# Patient Record
Sex: Female | Born: 1995 | Hispanic: No | Marital: Single | Smoking: Current every day smoker
Health system: Southern US, Community
[De-identification: ages and names within clinical notes are randomized; demographics above are authoritative.]

## PROBLEM LIST (undated history)

## (undated) VITALS — BP 122/84 | HR 89 | Temp 98.3°F | Resp 16 | Ht 64.0 in | Wt 127.9 lb

## (undated) DIAGNOSIS — H539 Unspecified visual disturbance: Secondary | ICD-10-CM

## (undated) DIAGNOSIS — F32A Depression, unspecified: Secondary | ICD-10-CM

## (undated) DIAGNOSIS — F329 Major depressive disorder, single episode, unspecified: Secondary | ICD-10-CM

## (undated) DIAGNOSIS — R002 Palpitations: Secondary | ICD-10-CM

## (undated) DIAGNOSIS — F419 Anxiety disorder, unspecified: Secondary | ICD-10-CM

## (undated) HISTORY — PX: APPENDECTOMY: SHX54

---
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
## 2015-06-12 ENCOUNTER — Encounter (HOSPITAL_COMMUNITY): Payer: Self-pay | Admitting: Emergency Medicine

## 2015-06-12 ENCOUNTER — Emergency Department (HOSPITAL_COMMUNITY)
Admission: EM | Admit: 2015-06-12 | Discharge: 2015-06-12 | Disposition: A | Discharge status: Home / Self Care | Payer: Self-pay | Attending: Emergency Medicine | Admitting: Emergency Medicine

## 2015-06-12 DIAGNOSIS — Z8669 Personal history of other diseases of the nervous system and sense organs: Secondary | ICD-10-CM | POA: Insufficient documentation

## 2015-06-12 DIAGNOSIS — Y929 Unspecified place or not applicable: Secondary | ICD-10-CM | POA: Insufficient documentation

## 2015-06-12 DIAGNOSIS — Z79899 Other long term (current) drug therapy: Secondary | ICD-10-CM | POA: Insufficient documentation

## 2015-06-12 DIAGNOSIS — Z720 Tobacco use: Secondary | ICD-10-CM | POA: Insufficient documentation

## 2015-06-12 DIAGNOSIS — T407X4A Poisoning by cannabis (derivatives), undetermined, initial encounter: Secondary | ICD-10-CM | POA: Insufficient documentation

## 2015-06-12 DIAGNOSIS — IMO0002 Reserved for concepts with insufficient information to code with codable children: Secondary | ICD-10-CM

## 2015-06-12 DIAGNOSIS — Z791 Long term (current) use of non-steroidal anti-inflammatories (NSAID): Secondary | ICD-10-CM | POA: Insufficient documentation

## 2015-06-12 DIAGNOSIS — Y939 Activity, unspecified: Secondary | ICD-10-CM | POA: Insufficient documentation

## 2015-06-12 DIAGNOSIS — Y999 Unspecified external cause status: Secondary | ICD-10-CM | POA: Insufficient documentation

## 2015-06-12 DIAGNOSIS — F419 Anxiety disorder, unspecified: Secondary | ICD-10-CM | POA: Insufficient documentation

## 2015-06-12 DIAGNOSIS — X58XXXA Exposure to other specified factors, initial encounter: Secondary | ICD-10-CM | POA: Insufficient documentation

## 2015-06-12 NOTE — ED Provider Notes (Signed)
CSN: [id number]     Arrival date & time 06/12/15  1410 History   First MD Initiated Contact with Patient 06/12/15 1507     Chief Complaint  Patient presents with  . Drug Overdose     (Consider location/radiation/quality/duration/timing/severity/associated sxs/prior Treatment) HPI   Erin Arroyo is a 19 y.o. female who presents for evaluation of feeling detached, and nervous. This started after she took some "LSD" given to her by a friend. She also smoked some marijuana tonight. She denies headache, neck pain, back pain, weakness or dizziness. She came here by private vehicle, with her father. She has been taking her usual psychiatric medications. There are no other no modifying factors.  Past Medical History  Diagnosis Date  . Vision abnormalities   . Anxiety   . Heart palpitations    Past Surgical History  Procedure Laterality Date  . Appendectomy  Age 60   Family History  Problem Relation Age of Onset  . Anxiety disorder Mother   . Depression Mother   . Anxiety disorder Brother   . Depression Brother   . Depression Father    Social History  Substance Use Topics  . Smoking status: Current Every Day Smoker -- 0.50 packs/day    Types: Cigarettes    Last Attempt to Quit: 08/14/2012  . Smokeless tobacco: None  . Alcohol Use: Yes     Comment: last use Dec 2014   OB History    No data available     Review of Systems  All other systems reviewed and are negative.     Allergies  Cat hair extract and Dust mite extract  Home Medications   Prior to Admission medications   Medication Sig Start Date End Date Taking? Authorizing Provider  ARIPiprazole (ABILIFY) 5 MG tablet Take 5 mg by mouth daily.   Yes Historical Provider, MD  ibuprofen (ADVIL,MOTRIN) 400 MG tablet Take 1 tablet (400 mg total) by mouth every 6 (six) hours as needed. Patient taking differently: Take 400 mg by mouth every 6 (six) hours as needed for mild pain or moderate pain.  04/07/14  Yes Derwood KaplanAnkit  Nanavati, MD  naproxen (NAPROSYN) 375 MG tablet Take 1 tablet (375 mg total) by mouth 2 (two) times daily as needed. 09/18/14  Yes Blane OharaJoshua Zavitz, MD  sertraline (ZOLOFT) 100 MG tablet Take 100 mg by mouth daily.   Yes Historical Provider, MD  benzonatate (TESSALON) 100 MG capsule Take 1 capsule (100 mg total) by mouth every 8 (eight) hours. Patient not taking: Reported on 06/12/2015 10/01/14   Renne CriglerJoshua Geiple, PA-C   BP 127/79 mmHg  Pulse 100  Temp(Src) 98.4 F (36.9 C) (Oral)  Resp 18  Ht 5\' 4"  (1.626 m)  SpO2 99% Physical Exam  Constitutional: She is oriented to person, place, and time. She appears well-developed and well-nourished.  HENT:  Head: Normocephalic and atraumatic.  Right Ear: External ear normal.  Left Ear: External ear normal.  Eyes: Conjunctivae and EOM are normal. Pupils are equal, round, and reactive to light.  Neck: Normal range of motion and phonation normal. Neck supple.  Cardiovascular: Normal rate, regular rhythm and normal heart sounds.   Pulmonary/Chest: Effort normal and breath sounds normal. She exhibits no bony tenderness.  Abdominal: Soft. There is no tenderness.  Musculoskeletal: Normal range of motion.  Neurological: She is alert and oriented to person, place, and time. No cranial nerve deficit or sensory deficit. She exhibits normal muscle tone. Coordination normal.  No dysarthria and aphasia or nystagmus  Skin: Skin is warm, dry and intact.  Psychiatric: She has a normal mood and affect. Her behavior is normal. Judgment and thought content normal.  Nursing note and vitals reviewed.   ED Course  Procedures (including critical care time) Medications - No data to display  Patient Vitals for the past 24 hrs:  BP Temp Temp src Pulse Resp SpO2 Height  06/12/15 1420 - - - - - -  (1.626 m)  06/12/15 1413 127/79 mmHg 98.4 F (36.9 C) Oral 100 18 99 % -    Findings the patient's father who is willing to take her home and watch her.   Labs  Review Labs Reviewed - No data to display  Imaging Review No results found. I have personally reviewed and evaluated these images and lab results as part of my medical decision-making.   EKG Interpretation None      MDM   Final diagnoses:  LSD reaction    Reported LSD ingestion, with very mild symptoms. Doubt significant overdose or aggravation of her chronic psychiatric illness.  Nursing Notes Reviewed/ Care Coordinated Applicable Imaging Reviewed Interpretation of Laboratory Data incorporated into ED treatment  The patient appears reasonably screened and/or stabilized for discharge and I doubt any other medical condition or other [facility] requiring further screening, evaluation, or treatment in the ED at this time prior to discharge.  Plan: Home Medications- usual; Home Treatments- rest; return here if the recommended treatment, does not improve the symptoms; Recommended follow up- PCP prn     Mancel Bale, MD 06/12/15 2316

## 2015-06-12 NOTE — ED Notes (Signed)
BIB father, reports taking LSD and smoking marijuana, endorses hallucinations, is emotional and afraid but calm and cooperative, no pain

## 2015-06-12 NOTE — Discharge Instructions (Signed)
Avoid illegal substances.

## 2015-11-10 ENCOUNTER — Encounter (HOSPITAL_COMMUNITY): Payer: Self-pay | Admitting: Emergency Medicine

## 2015-11-10 ENCOUNTER — Emergency Department (HOSPITAL_COMMUNITY)
Admission: EM | Admit: 2015-11-10 | Discharge: 2015-11-10 | Disposition: A | Discharge status: Home / Self Care | Payer: Self-pay | Attending: Emergency Medicine | Admitting: Emergency Medicine

## 2015-11-10 ENCOUNTER — Emergency Department (HOSPITAL_COMMUNITY): Payer: Self-pay

## 2015-11-10 DIAGNOSIS — F419 Anxiety disorder, unspecified: Secondary | ICD-10-CM | POA: Insufficient documentation

## 2015-11-10 DIAGNOSIS — Y9389 Activity, other specified: Secondary | ICD-10-CM | POA: Insufficient documentation

## 2015-11-10 DIAGNOSIS — Y998 Other external cause status: Secondary | ICD-10-CM | POA: Insufficient documentation

## 2015-11-10 DIAGNOSIS — Z79899 Other long term (current) drug therapy: Secondary | ICD-10-CM | POA: Insufficient documentation

## 2015-11-10 DIAGNOSIS — M25571 Pain in right ankle and joints of right foot: Secondary | ICD-10-CM

## 2015-11-10 DIAGNOSIS — F17210 Nicotine dependence, cigarettes, uncomplicated: Secondary | ICD-10-CM | POA: Insufficient documentation

## 2015-11-10 DIAGNOSIS — Y9289 Other specified places as the place of occurrence of the external cause: Secondary | ICD-10-CM | POA: Insufficient documentation

## 2015-11-10 DIAGNOSIS — X58XXXA Exposure to other specified factors, initial encounter: Secondary | ICD-10-CM | POA: Insufficient documentation

## 2015-11-10 DIAGNOSIS — S99911A Unspecified injury of right ankle, initial encounter: Secondary | ICD-10-CM | POA: Insufficient documentation

## 2015-11-10 MED ORDER — IBUPROFEN 800 MG PO TABS: 800.0000 mg | ORAL_TABLET | Freq: Three times a day (TID) | ORAL | Status: DC | PRN

## 2015-11-10 NOTE — ED Notes (Signed)
Declined W/C at D/C and was escorted to lobby by RN. 

## 2015-11-10 NOTE — Discharge Instructions (Signed)
Read the information below.  Use the prescribed medication as directed.  Please discuss all new medications with your pharmacist.  You may return to the Emergency Department at any time for worsening condition or any new symptoms that concern you.    If you develop uncontrolled pain, weakness or numbness of the extremity, severe discoloration of the skin, or you are unable to walk, return to the ER for a recheck.      Ankle Pain Ankle pain is a common symptom. The bones, cartilage, tendons, and muscles of the ankle joint perform a lot of work each day. The ankle joint holds your body weight and allows you to move around. Ankle pain can occur on either side or back of 1 or both ankles. Ankle pain may be sharp and burning or dull and aching. There may be tenderness, stiffness, redness, or warmth around the ankle. The pain occurs more often when a person walks or puts pressure on the ankle. CAUSES  There are many reasons ankle pain can develop. It is important to work with your caregiver to identify the cause since many conditions can impact the bones, cartilage, muscles, and tendons. Causes for ankle pain include:  Injury, including a break (fracture), sprain, or strain often due to a fall, sports, or a high-impact activity.  Swelling (inflammation) of a tendon (tendonitis).  Achilles tendon rupture.  Ankle instability after repeated sprains and strains.  Poor foot alignment.  Pressure on a nerve (tarsal tunnel syndrome).  Arthritis in the ankle or the lining of the ankle.  Crystal formation in the ankle (gout or pseudogout). DIAGNOSIS  A diagnosis is based on your medical history, your symptoms, results of your physical exam, and results of diagnostic tests. Diagnostic tests may include X-ray exams or a computerized magnetic scan (magnetic resonance imaging, MRI). TREATMENT  Treatment will depend on the cause of your ankle pain and may include:  Keeping pressure off the ankle and limiting  activities.  Using crutches or other walking support (a cane or brace).  Using rest, ice, compression, and elevation.  Participating in physical therapy or home exercises.  Wearing shoe inserts or special shoes.  Losing weight.  Taking medications to reduce pain or swelling or receiving an injection.  Undergoing surgery. HOME CARE INSTRUCTIONS   Only take over-the-counter or prescription medicines for pain, discomfort, or fever as directed by your caregiver.  Put ice on the injured area.  Put ice in a plastic bag.  Place a towel between your skin and the bag.  Leave the ice on for 15-20 minutes at a time, 03-04 times a day.  Keep your leg raised (elevated) when possible to lessen swelling.  Avoid activities that cause ankle pain.  Follow specific exercises as directed by your caregiver.  Record how often you have ankle pain, the location of the pain, and what it feels like. This information may be helpful to you and your caregiver.  Ask your caregiver about returning to work or sports and whether you should drive.  Follow up with your caregiver for further examination, therapy, or testing as directed. SEEK MEDICAL CARE IF:   Pain or swelling continues or worsens beyond 1 week.  You have an oral temperature above 102 F (38.9 C).  You are feeling unwell or have chills.  You are having an increasingly difficult time with walking.  You have loss of sensation or other new symptoms.  You have questions or concerns. MAKE SURE YOU:   Understand these instructions.  Will watch your condition.  Will get help right away if you are not doing well or get worse.   This information is not intended to replace advice given to you by your health care provider. Make sure you discuss any questions you have with your health care provider.   Document Released: 01/27/2010 Document Revised: 11/01/2011 Document Reviewed: 03/11/2015 Elsevier Interactive Patient Education 2016  [employer]. [employer] Assistance The [employer] 211 is a great source of information about community services available.  Access by dialing 2-1-1 from anywhere in  [place], or by website -  PooledIncome.pl.   Other Local Resources (Updated 08/2015)  Financial Assistance   Services    Phone Number and Address  [facility]  Low-cost medical care - 1st and 3rd Saturday of every month  Must not qualify for public or private insurance and must have limited income [phone] [address] [place], [place]    [place] [employer] of Social Services  Child care  Emergency assistance for housing and [employer]  Medicaid [phone] [address] [place], [place] [address]   [facility] Department  Low-cost medical care for children, communicable diseases, sexually-transmitted diseases, immunizations, maternity care, womens health and family planning [phone] [address] [place], [place] [address]  [facility]   Medication assistance for [facility] residents  Must meet income requirements [phone] [address] [place], [place].    [facility] Social Services  Child care  Emergency assistance for housing and [employer]  Medicaid [phone] [address] [place], [place] [address]  [facility]   Low-cost medical care,   Monday through Friday, 9 am to 6 pm.   Accepts Medicare/Medicaid, and self-pay [phone] [address] [place], [place] [address]  [facility]  Low-cost medical care - Monday through Friday, 8:30 am - 5:30 pm  Accepts Medicaid and self-pay [phone] [address] [place], [place] [address]   [facility]  Primary medical care, including for those with sickle cell  disease  Accepts Medicare, Medicaid, insurance and self-pay [phone] [address] [place], [place]  [facility]   Primary medical care  Accepts Medicare, [place], insurance and self-pay [phone] [address] [place], [place] [address]   [facility] Department of Social Services  Child care  Emergency assistance for housing and [employer]  Medicaid [phone] [address] [place], [place] [address]  [facility] Department of Health and [employer]  Child care  Emergency assistance for housing and [employer]  Medicaid [phone] [address] [place], [place] [address]   [facility] Medication Assistance Program  Medication assistance for [facility] residents with no insurance only  Must have a primary care doctor [phone] [address] [place], [place]  [facility]   Primary medical care  [place], [place], insurance  [phone] [address] [place], [place]  [facility]   Medication assistance [phone]  [facility]   Primary medical care  Accepts Medicare, [place], insurance and self-pay [phone] [address] [place], [place] [address]  [facility]   Primary medical care  Accepts Medicare, [place], insurance and self-pay [phone] [address] [place], [place] [address]  [facility]  For [place] residents between the ages  of 18 and 64 who do not have any form of health insurance, Medicare, Medicaid, or VA benefits.  Services are provided free of charge to uninsured patients who fall within federal poverty guidelines.    Hours: Tuesdays and Thursdays, 4:15 - 8 pm [phone] [address] [place], [place] [address]  [facility]     Primary medical care  Dental care  Nutritional  counseling  Pharmacy  Accepts Medicaid, Medicare, most insurance.  Fees are adjusted based on ability to pay.   [phone] [facility] [address] [place], [place]  [phone] [facility] [address] [place], [place]  [phone] [facility] [place], [place]  [phone] [facility], [address] [place], [place]  [phone] [facility] [address] [place], [place]  [facility]  Womens health and family planning [phone] [address] [facility] [employer] [facility] [place], [place]  [facility] Department of Social Services  Child care  Emergency assistance for housing and [employer]  Medicaid [phone] [place], [place] [address]   [facility]    Ages 38 and older  Hours: Mondays and Thursdays, 7:00 am - 9:00 am Patients are seen on a first come, first served basis. [phone], ext. [phone] [place], [place]  [facility] Division of Social Services  Child care  Emergency assistance for housing and [employer]  Medicaid [phone] [address] [place], [place] [address]  [facility]  Medication assistance  Rental assistance  Food pantry  Medication assistance  Housing assistance  Emergency food distribution  Utility assistance [phone] [address] [place], [place]  [phone]  [address] [place], [place] [address] Hours: Tuesdays and Thursdays from 9am - 12 noon by appointment only  [phone] [address] [place], [place] [address]  [facility] - Lanae Boast   Accepts private insurance, [place], and [place].  Payment is based on a sliding scale for those without insurance.  Hours: Mondays, Tuesdays and Thursdays, 8:30 am - 5:30 pm.   [phone] [address] [place], [place]   [facility] - Family Medicine at [facility], [place], and [place].  Payment is based on a sliding scale for those without insurance. [phone] [address] [place], [place]  [facility] - Pediatrics at [address] Scientist, research (physical sciences), [employer], and [place].  Payment is based on a sliding scale for those without insurance [phone] [address], [employer], [place]  [facility] - Pediatrics at [employer], [place], and [place].  Payment is based on a sliding scale for those without insurance. [phone] [address] [place], [place]  [facility] - Pediatrics at [facility], [place], and [place].  Payment is based on a sliding scale for those without insurance. [phone], [phone] [place], [place].    [facility]  Maternity care.  Accepts Medicaid and self-pay. [phone] [address] [place], [place]

## 2015-11-10 NOTE — ED Provider Notes (Signed)
CSN: [id number]     Arrival date & time 11/10/15  0709 History   First MD Initiated Contact with Patient 11/10/15 [phone]     Chief Complaint  Patient presents with  . Ankle Pain     (Consider location/radiation/quality/duration/timing/severity/associated sxs/prior Treatment) The history is provided by the patient.     Pt presents with right ankle pain after stepping off a curb and hearing a pop yesterday.  Has tenderness in the medial/anterior aspect of her ankle only.  Denies other pain.  Feels pain with moving toes.  Denies weakness or numbness.  Has taken excedrine for pain.    Past Medical History  Diagnosis Date  . Vision abnormalities   . Anxiety   . Heart palpitations    Past Surgical History  Procedure Laterality Date  . Appendectomy  Age 20   Family History  Problem Relation Age of Onset  . Anxiety disorder Mother   . Depression Mother   . Anxiety disorder Brother   . Depression Brother   . Depression Father    Social History  Substance Use Topics  . Smoking status: Current Every Day Smoker -- 0.50 packs/day    Types: Cigarettes    Last Attempt to Quit: 08/14/2012  . Smokeless tobacco: None  . Alcohol Use: No     Comment: last use Dec 2014   OB History    No data available     Review of Systems  Constitutional: Negative for fever and chills.  Cardiovascular: Negative for leg swelling.  Musculoskeletal: Positive for arthralgias.  Skin: Negative for color change, pallor, rash and wound.  Allergic/Immunologic: Negative for immunocompromised state.  Neurological: Negative for weakness and numbness.  Hematological: Does not bruise/bleed easily.  Psychiatric/Behavioral: Positive for self-injury (accidental ).      Allergies  Cat hair extract and Dust mite extract  Home Medications   Prior to Admission medications   Medication Sig Start Date End Date Taking? Authorizing Provider  ARIPiprazole (ABILIFY) 5 MG tablet Take 5 mg by mouth daily.     Historical Provider, MD  benzonatate (TESSALON) 100 MG capsule Take 1 capsule (100 mg total) by mouth every 8 (eight) hours. Patient not taking: Reported on 06/12/2015 10/01/14   Renne CriglerJoshua Geiple, PA-C  ibuprofen (ADVIL,MOTRIN) 400 MG tablet Take 1 tablet (400 mg total) by mouth every 6 (six) hours as needed. Patient taking differently: Take 400 mg by mouth every 6 (six) hours as needed for mild pain or moderate pain.  04/07/14   Derwood KaplanAnkit Nanavati, MD  naproxen (NAPROSYN) 375 MG tablet Take 1 tablet (375 mg total) by mouth 2 (two) times daily as needed. 09/18/14   Blane OharaJoshua Zavitz, MD  sertraline (ZOLOFT) 100 MG tablet Take 100 mg by mouth daily.    Historical Provider, MD   BP 125/84 mmHg  Pulse 97  Temp(Src) 98.3 F (36.8 C) (Oral)  Resp 18  Ht 5\' 5"  (1.651 m)  Wt 68.04 kg  BMI 24.96 kg/m2  SpO2 98%  LMP 10/19/2015 Physical Exam  Constitutional: She appears well-developed and well-nourished. No distress.  HENT:  Head: Normocephalic and atraumatic.  Neck: Neck supple.  Pulmonary/Chest: Effort normal.  Musculoskeletal:       Right ankle: She exhibits no ecchymosis, no deformity, no laceration and normal pulse. Achilles tendon normal.       Right lower leg: She exhibits no swelling, no edema, no deformity and no laceration.       Right foot: Normal.       Feet:  Right lateral lower leg with tattoos (most recent is 29 month old)  No erythema, edema, warmth, focal tenderness.    Neurological: She is alert.  Skin: She is not diaphoretic.  Nursing note and vitals reviewed.   ED Course  Procedures (including critical care time) Labs Review Labs Reviewed - No data to display  Imaging Review Dg Ankle Complete Right  11/10/2015  CLINICAL DATA:  Fall, missed a step of the curb 1 day ago, right ankle pain, rolled ankle EXAM: RIGHT ANKLE - COMPLETE 3+ VIEW COMPARISON:  None. FINDINGS: Three views of the right ankle submitted. No acute fracture or subluxation. No radiopaque foreign body. Ankle  mortise is preserved. IMPRESSION: Negative. Electronically Signed   By: Natasha Mead M.D.   On: 11/10/2015 08:02   I have personally reviewed and evaluated these images and lab results as part of my medical decision-making.   EKG Interpretation None      MDM   Final diagnoses:  Right ankle pain    Afebrile, nontoxic patient with injury to her right ankle while stepping off a curb.   Xray negative.  Neurovascularly intact.  ASO ordered, pt requested CAM walker and was given CAM walker and crutches.   D/C home with ibuprofen, PCP follow up PRN.  Discussed result, findings, treatment, and follow up  with patient.  Pt given return precautions.  Pt verbalizes understanding and agrees with plan.        [place], PA-C 11/10/15 1610  Melene Plan, DO 11/10/15 [phone]

## 2015-11-10 NOTE — Progress Notes (Signed)
Orthopedic Tech Progress Note Patient Details:  Donnajean LopesMaha Barich Nov 14, 1995 [id number]  Ortho Devices Type of Ortho Device: Crutches, CAM walker Ortho Device/Splint Interventions: Application, Adjustment   Saul FordyceJennifer C Cielo Arias 11/10/2015, 9:21 AM

## 2015-11-10 NOTE — ED Notes (Signed)
Pt reports that she missed a step off the curb and felt her right ankle roll.

## 2015-12-21 ENCOUNTER — Emergency Department (HOSPITAL_COMMUNITY): Payer: Self-pay

## 2015-12-21 ENCOUNTER — Encounter (HOSPITAL_COMMUNITY): Payer: Self-pay | Admitting: Emergency Medicine

## 2015-12-21 ENCOUNTER — Emergency Department (HOSPITAL_COMMUNITY)
Admission: EM | Admit: 2015-12-21 | Discharge: 2015-12-22 | Disposition: A | Discharge status: Other Acute Inpatient Hospital | Payer: Self-pay | Attending: Emergency Medicine | Admitting: Emergency Medicine

## 2015-12-21 DIAGNOSIS — S40021A Contusion of right upper arm, initial encounter: Secondary | ICD-10-CM | POA: Insufficient documentation

## 2015-12-21 DIAGNOSIS — R45851 Suicidal ideations: Secondary | ICD-10-CM | POA: Insufficient documentation

## 2015-12-21 DIAGNOSIS — F17210 Nicotine dependence, cigarettes, uncomplicated: Secondary | ICD-10-CM | POA: Insufficient documentation

## 2015-12-21 DIAGNOSIS — F1210 Cannabis abuse, uncomplicated: Secondary | ICD-10-CM | POA: Insufficient documentation

## 2015-12-21 DIAGNOSIS — F1310 Sedative, hypnotic or anxiolytic abuse, uncomplicated: Secondary | ICD-10-CM | POA: Insufficient documentation

## 2015-12-21 DIAGNOSIS — Y92009 Unspecified place in unspecified non-institutional (private) residence as the place of occurrence of the external cause: Secondary | ICD-10-CM | POA: Insufficient documentation

## 2015-12-21 DIAGNOSIS — S0990XA Unspecified injury of head, initial encounter: Secondary | ICD-10-CM | POA: Insufficient documentation

## 2015-12-21 DIAGNOSIS — S0083XA Contusion of other part of head, initial encounter: Secondary | ICD-10-CM | POA: Insufficient documentation

## 2015-12-21 DIAGNOSIS — Y9389 Activity, other specified: Secondary | ICD-10-CM | POA: Insufficient documentation

## 2015-12-21 DIAGNOSIS — S199XXA Unspecified injury of neck, initial encounter: Secondary | ICD-10-CM | POA: Insufficient documentation

## 2015-12-21 DIAGNOSIS — F1410 Cocaine abuse, uncomplicated: Secondary | ICD-10-CM | POA: Insufficient documentation

## 2015-12-21 DIAGNOSIS — Y998 Other external cause status: Secondary | ICD-10-CM | POA: Insufficient documentation

## 2015-12-21 DIAGNOSIS — Z79899 Other long term (current) drug therapy: Secondary | ICD-10-CM | POA: Insufficient documentation

## 2015-12-21 DIAGNOSIS — F419 Anxiety disorder, unspecified: Secondary | ICD-10-CM | POA: Insufficient documentation

## 2015-12-21 DIAGNOSIS — Z3202 Encounter for pregnancy test, result negative: Secondary | ICD-10-CM | POA: Insufficient documentation

## 2015-12-21 DIAGNOSIS — Z8669 Personal history of other diseases of the nervous system and sense organs: Secondary | ICD-10-CM | POA: Insufficient documentation

## 2015-12-21 LAB — URINALYSIS, ROUTINE W REFLEX MICROSCOPIC
Bilirubin Urine: NEGATIVE
GLUCOSE, UA: NEGATIVE mg/dL
Ketones, ur: NEGATIVE mg/dL
LEUKOCYTES UA: NEGATIVE
Nitrite: NEGATIVE
PH: 6 (ref 5.0–8.0)
Protein, ur: NEGATIVE mg/dL
Specific Gravity, Urine: 1.008 (ref 1.005–1.030)

## 2015-12-21 LAB — CBC WITH DIFFERENTIAL/PLATELET
BASOS PCT: 1 %
Basophils Absolute: 0.1 10*3/uL (ref 0.0–0.1)
EOS ABS: 0.3 10*3/uL (ref 0.0–0.7)
Eosinophils Relative: 3 %
HCT: 42 % (ref 36.0–46.0)
HEMOGLOBIN: 14.2 g/dL (ref 12.0–15.0)
Lymphocytes Relative: 26 %
Lymphs Abs: 2.3 10*3/uL (ref 0.7–4.0)
MCH: 29.8 pg (ref 26.0–34.0)
MCHC: 33.8 g/dL (ref 30.0–36.0)
MCV: 88.2 fL (ref 78.0–100.0)
MONO ABS: 0.8 10*3/uL (ref 0.1–1.0)
MONOS PCT: 9 %
NEUTROS PCT: 61 %
Neutro Abs: 5.4 10*3/uL (ref 1.7–7.7)
Platelets: 317 10*3/uL (ref 150–400)
RBC: 4.76 MIL/uL (ref 3.87–5.11)
RDW: 13.1 % (ref 11.5–15.5)
WBC: 8.8 10*3/uL (ref 4.0–10.5)

## 2015-12-21 LAB — ETHANOL: Alcohol, Ethyl (B): 130 mg/dL — ABNORMAL HIGH (ref <5)

## 2015-12-21 LAB — RAPID URINE DRUG SCREEN, HOSP PERFORMED
Amphetamines: NOT DETECTED
Barbiturates: NOT DETECTED
Benzodiazepines: POSITIVE — AB
COCAINE: POSITIVE — AB
OPIATES: NOT DETECTED
Tetrahydrocannabinol: POSITIVE — AB

## 2015-12-21 LAB — COMPREHENSIVE METABOLIC PANEL
ALBUMIN: 3.9 g/dL (ref 3.5–5.0)
ALK PHOS: 85 U/L (ref 38–126)
ALT: 35 U/L (ref 14–54)
ANION GAP: 12 (ref 5–15)
AST: 51 U/L — ABNORMAL HIGH (ref 15–41)
BILIRUBIN TOTAL: 0.4 mg/dL (ref 0.3–1.2)
BUN: 6 mg/dL (ref 6–20)
CALCIUM: 9.2 mg/dL (ref 8.9–10.3)
CO2: 20 mmol/L — ABNORMAL LOW (ref 22–32)
Chloride: 108 mmol/L (ref 101–111)
Creatinine, Ser: 0.75 mg/dL (ref 0.44–1.00)
GFR calc Af Amer: >60 mL/min (ref >60)
GLUCOSE: 93 mg/dL (ref 65–99)
Potassium: 3.5 mmol/L (ref 3.5–5.1)
Sodium: 140 mmol/L (ref 135–145)
TOTAL PROTEIN: 8 g/dL (ref 6.5–8.1)

## 2015-12-21 LAB — SALICYLATE LEVEL: Salicylate Lvl: <4 mg/dL (ref 2.8–30.0)

## 2015-12-21 LAB — URINE MICROSCOPIC-ADD ON: WBC, UA: NONE SEEN WBC/hpf (ref 0–5)

## 2015-12-21 LAB — PREGNANCY, URINE: Preg Test, Ur: NEGATIVE

## 2015-12-21 LAB — ACETAMINOPHEN LEVEL

## 2015-12-21 MED ORDER — ONDANSETRON HCL 4 MG PO TABS: 4.0000 mg | ORAL_TABLET | Freq: Three times a day (TID) | ORAL | Status: DC | PRN

## 2015-12-21 MED ORDER — ACETAMINOPHEN 325 MG PO TABS: 650.0000 mg | ORAL_TABLET | ORAL | Status: DC | PRN

## 2015-12-21 MED ORDER — SERTRALINE HCL 50 MG PO TABS
100.0000 mg | ORAL_TABLET | Freq: Every day | ORAL | Status: DC
  Administered 2015-12-21 – 2015-12-22 (×2): 100 mg via ORAL
  Filled 2015-12-21 (×2): qty 2

## 2015-12-21 MED ORDER — ARIPIPRAZOLE 10 MG PO TABS
5.0000 mg | ORAL_TABLET | Freq: Every day | ORAL | Status: DC
  Administered 2015-12-21: 5 mg via ORAL
  Filled 2015-12-21: qty 1
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
## 2015-12-21 NOTE — ED Notes (Signed)
Pt now states if she goes home she will try to kill herself er md notified
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
## 2015-12-21 NOTE — ED Provider Notes (Signed)
I received a call from pharmacy, that the patient is not currently taking Abilify. This medication was continued in favor of another second-generation antipsychotic (Rexulti). That medicine [facility]. At this point will not change medications, but have psychiatry evaluate the patient, for appropriate medication administration in conjunction with their evaluation of her suicidal ideation.  Mancel BaleElliott Sherea Liptak, MD 12/21/15 2218
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
## 2015-12-21 NOTE — ED Notes (Signed)
Pt given hygiene supplies
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
## 2015-12-21 NOTE — ED Notes (Signed)
Sleeping soundly at this time with no complaints voiced.

## 2015-12-21 NOTE — BH Assessment (Signed)
[facility] Assessment Progress Note At 1302, 12/21/15, Pt requested reassessment away from presence of father (father was present for initial assessment).  During reassessment, Pt endorsed strong desire to self-injure by cutting and scratching skin "until the blood gushes."  Pt said she denied suicidal ideation and self-injury desire because father was whispering off-camera that she should deny any intent to self-harm.  Now that father is out of the room, "I really want to come in ... If I go home, I'm going to hurt myself."  All other factors from first assessment are intact.  Consulted with S. Rankin and provided updated information.  Per S. Rankin, NP, Pt meets inpatient criteria.

## 2015-12-22 ENCOUNTER — Encounter (HOSPITAL_COMMUNITY): Payer: Self-pay | Admitting: *Deleted

## 2015-12-22 ENCOUNTER — Inpatient Hospital Stay (HOSPITAL_COMMUNITY)
Admission: AD | Admit: 2015-12-22 | Discharge: 2015-12-23 | DRG: 885 | Disposition: A | Discharge status: Home / Self Care | Payer: Federal, State, Local not specified - Other | Source: Intra-hospital | Attending: Psychiatry | Admitting: Psychiatry

## 2015-12-22 DIAGNOSIS — F411 Generalized anxiety disorder: Secondary | ICD-10-CM | POA: Diagnosis present

## 2015-12-22 DIAGNOSIS — R45851 Suicidal ideations: Secondary | ICD-10-CM | POA: Diagnosis present

## 2015-12-22 DIAGNOSIS — F410 Panic disorder [episodic paroxysmal anxiety] without agoraphobia: Secondary | ICD-10-CM | POA: Diagnosis present

## 2015-12-22 DIAGNOSIS — Z818 Family history of other mental and behavioral disorders: Secondary | ICD-10-CM

## 2015-12-22 DIAGNOSIS — F17210 Nicotine dependence, cigarettes, uncomplicated: Secondary | ICD-10-CM | POA: Diagnosis present

## 2015-12-22 DIAGNOSIS — F332 Major depressive disorder, recurrent severe without psychotic features: Secondary | ICD-10-CM | POA: Diagnosis present

## 2015-12-22 DIAGNOSIS — Z915 Personal history of self-harm: Secondary | ICD-10-CM

## 2015-12-22 DIAGNOSIS — F1910 Other psychoactive substance abuse, uncomplicated: Secondary | ICD-10-CM | POA: Diagnosis not present

## 2015-12-22 MED ORDER — MAGNESIUM HYDROXIDE 400 MG/5ML PO SUSP: 30.0000 mL | Freq: Every day | ORAL | Status: DC | PRN

## 2015-12-22 MED ORDER — ACETAMINOPHEN 325 MG PO TABS: 650.0000 mg | ORAL_TABLET | Freq: Four times a day (QID) | ORAL | Status: DC | PRN

## 2015-12-22 MED ORDER — ONDANSETRON HCL 4 MG PO TABS: 4.0000 mg | ORAL_TABLET | Freq: Three times a day (TID) | ORAL | Status: DC | PRN

## 2015-12-22 MED ORDER — NICOTINE 21 MG/24HR TD PT24
21.0000 mg | MEDICATED_PATCH | Freq: Every day | TRANSDERMAL | Status: DC
  Administered 2015-12-23: 21 mg via TRANSDERMAL
  Filled 2015-12-22 (×3): qty 1

## 2015-12-22 MED ORDER — ALUM & MAG HYDROXIDE-SIMETH 200-200-20 MG/5ML PO SUSP: 30.0000 mL | ORAL | Status: DC | PRN

## 2015-12-22 MED ORDER — NICOTINE 21 MG/24HR TD PT24
21.0000 mg | MEDICATED_PATCH | Freq: Every day | TRANSDERMAL | Status: DC
  Administered 2015-12-22: 21 mg via TRANSDERMAL
  Filled 2015-12-22: qty 1

## 2015-12-22 MED ORDER — TRAZODONE HCL 50 MG PO TABS
50.0000 mg | ORAL_TABLET | Freq: Every evening | ORAL | Status: DC | PRN
  Filled 2015-12-22: qty 7

## 2015-12-22 MED ORDER — SERTRALINE HCL 100 MG PO TABS
100.0000 mg | ORAL_TABLET | Freq: Every day | ORAL | Status: DC
  Administered 2015-12-23: 100 mg via ORAL
  Filled 2015-12-22: qty 1
  Filled 2015-12-22: qty 7
  Filled 2015-12-22 (×2): qty 1

## 2015-12-22 MED ORDER — HYDROXYZINE HCL 25 MG PO TABS: 25.0000 mg | ORAL_TABLET | Freq: Four times a day (QID) | ORAL | Status: DC | PRN

## 2015-12-22 NOTE — Progress Notes (Signed)
Patient was accepted at [facility], to Dr. Dub MikesLugo, room 302-[facility], arrive at 20:00. RN aware. Call report #: [phone].  Melbourne Abtsatia Samauri Kellenberger, LCSWA Disposition staff 12/22/2015 4:11 PM

## 2015-12-22 NOTE — Tx Team (Signed)
Initial Interdisciplinary Treatment Plan   PATIENT STRESSORS: Legal issue Substance abuse Traumatic event   PATIENT STRENGTHS: Ability for insight Average or above average intelligence Physical Health Supportive family/friends   PROBLEM LIST: Problem List/Patient Goals Date to be addressed Date deferred Reason deferred Estimated date of resolution  At risk for suicide 12/22/2015  12/22/2015   D/C  Anxiety 12/22/2015  12/22/2015   D/C  Depression 12/22/2015  12/22/2015   D/C  "No smoking as much weed as I do" 12/22/2015  12/22/2015   D/C  "I just want to go home tomorrow" 12/22/2015  12/22/2015   D/C                           DISCHARGE CRITERIA:  Improved stabilization in mood, thinking, and/or behavior Motivation to continue treatment in a less acute level of care Need for constant or close observation no longer present Withdrawal symptoms are absent or subacute and managed without 24-hour nursing intervention  PRELIMINARY DISCHARGE PLAN: Outpatient therapy Return to previous living arrangement Return to previous work or school arrangements  PATIENT/FAMIILY INVOLVEMENT: This treatment plan has been presented to and reviewed with the patient, Erin Arroyo.  The patient and family have been given the opportunity to ask questions and make suggestions.  Erin Arroyo, Erin Arroyo 12/22/2015, 11:00 PM
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
## 2015-12-22 NOTE — ED Notes (Signed)
Pt up walking in hallway

## 2015-12-22 NOTE — ED Notes (Signed)
Ordered patient breakfast Tray.

## 2015-12-22 NOTE — ED Notes (Signed)
[facility] pt may go there at 2100

## 2015-12-23 ENCOUNTER — Encounter (HOSPITAL_COMMUNITY): Payer: Self-pay | Admitting: Psychiatry

## 2015-12-23 DIAGNOSIS — F332 Major depressive disorder, recurrent severe without psychotic features: Principal | ICD-10-CM

## 2015-12-23 DIAGNOSIS — F1910 Other psychoactive substance abuse, uncomplicated: Secondary | ICD-10-CM

## 2015-12-23 MED ORDER — BREXPIPRAZOLE 2 MG PO TABS: 2.0000 mg | ORAL_TABLET | Freq: Every evening | ORAL | Status: AC

## 2015-12-23 MED ORDER — ETONOGESTREL 68 MG ~~LOC~~ IMPL: 68.0000 mg | DRUG_IMPLANT | Freq: Once | SUBCUTANEOUS | Status: AC

## 2015-12-23 MED ORDER — SERTRALINE HCL 100 MG PO TABS: 200.0000 mg | ORAL_TABLET | Freq: Every day | ORAL | Status: DC

## 2015-12-23 MED ORDER — CEFTRIAXONE SODIUM 250 MG IJ SOLR
250.0000 mg | Freq: Once | INTRAMUSCULAR | Status: AC
  Administered 2015-12-23: 250 mg via INTRAMUSCULAR
  Filled 2015-12-23: qty 250

## 2015-12-23 MED ORDER — CEFUROXIME AXETIL 500 MG PO TABS
500.0000 mg | ORAL_TABLET | Freq: Two times a day (BID) | ORAL | Status: DC
  Filled 2015-12-23: qty 1

## 2015-12-23 MED ORDER — AZITHROMYCIN 250 MG PO TABS
1000.0000 mg | ORAL_TABLET | Freq: Once | ORAL | Status: AC
  Administered 2015-12-23: 1000 mg via ORAL
  Filled 2015-12-23: qty 4
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
## 2015-12-23 NOTE — [facility] Group Notes (Signed)
[facility] LCSW Group Therapy  12/23/2015 1:31 PM  Type of Therapy:  Group Therapy  Participation Level:  Active  Participation Quality:  Attentive  Affect:  Appropriate  Cognitive:  Alert and Oriented  Insight:  Improving  Engagement in Therapy:  Engaged  Modes of Intervention:  Discussion, Education, Exploration, Problem-solving, Rapport Building, Socialization and Support  Summary of Progress/Problems: MHA Speaker came to talk about his personal journey with substance abuse and addiction. The pt processed ways by which to relate to the speaker. MHA speaker provided handouts and educational information pertaining to groups and services offered by the [facility].   Smart, Lendon George LCSW 12/23/2015, 1:31 PM

## 2015-12-23 NOTE — Progress Notes (Signed)
  [facility] :  Will you be returning to the same living situation after discharge:  Yes,  home with parents At discharge, do you have transportation home?: Yes,  pt' mother will pick her up today after lunch. Do you have the ability to pay for your medications: Yes,  mental health  Release of information consent forms completed and submitted to medical records by CSW.  Patient to Follow up at: Follow-up Information    Follow up with [facility] On 01/14/2016.   Why:  Appt on this date for medication management at 8:00AM. They will    Contact information:   [address] [place], [place] [address] Phone: [phone] Fax: [phone]      Next level of care provider [facility] to [facility] Link:no  Safety Planning and Suicide Prevention discussed: Yes,  Contact attempts made with pt's father. SPI pamphlet and mobile crisis information provided to pt and she was encouraged to share information with support network.   Have you used any form of tobacco in the last 30 days? (Cigarettes, Smokeless Tobacco, Cigars, and/or Pipes): Yes  Has patient been referred to the Quitline?: Patient refused referral  Patient has been referred for addiction treatment: Yes  Smart, Jamariyah Johannsen LCSW 12/23/2015, 11:15 AM

## 2015-12-23 NOTE — [facility] Suicide Risk Assessment (Signed)
[facility] INPATIENT:  Family/Significant Other Suicide Prevention Education  Suicide Prevention Education:  Contact Attempts: Mr. Erin Arroyo (pt's father) [phone] has been identified by the patient as the family member/significant other with whom the patient will be residing, and identified as the person(s) who will aid the patient in the event of a mental health crisis.  With written consent from the patient, two attempts were made to provide suicide prevention education, prior to and/or following the patient's discharge.  We were unsuccessful in providing suicide prevention education.  A suicide education pamphlet was given to the patient to share with family/significant other.  Date and time of first attempt: 12/23/15 at 9:45AM (voicemail left)  Date and time of second attempt: 12/23/15 at 11:15AM (voicemail left requesting call back at his earliest convenience).   Smart, Lillah Standre LCSW 12/23/2015, 11:12 AM

## 2015-12-23 NOTE — [facility] Group Notes (Signed)
[facility] Group Notes:  (Nursing/MHT/Case Management/Adjunct)  Date:  12/23/2015  Time:  0900 am  Type of Therapy:  Psychoeducational Skills  Participation Level:  Did Not Attend  Patient invited; declined to attend.  Rena Sweeden Evans 12/23/2015, 11:45 AM 

## 2015-12-23 NOTE — [facility] Suicide Risk Assessment (Signed)
[facility]   Principal Problem: MDD (major depressive disorder), recurrent episode, severe (HCC) Discharge Diagnoses:  Patient Active Problem List   Diagnosis Date Noted  . MDD (major depressive disorder), recurrent episode, severe (HCC) [F33.2] 12/22/2015  . Depressive disorder [F32.9] 09/14/2012  . GAD (generalized anxiety disorder) [F41.1] 09/14/2012  . Polysubstance abuse [F19.10] 09/14/2012    Total Time spent with patient: 45 minutes  Musculoskeletal: Strength & Muscle Tone: within normal limits Gait & Station: normal Patient leans: normal  Psychiatric Specialty Exam: Review of Systems  Constitutional: Negative.   HENT: Negative.   Eyes: Negative.   Respiratory: Negative.   Cardiovascular: Negative.   Gastrointestinal: Negative.   Genitourinary: Negative.   Musculoskeletal: Negative.   Skin: Negative.   Neurological: Negative.   Endo/Heme/Allergies: Negative.   Psychiatric/Behavioral: Positive for depression and substance abuse. The patient is nervous/anxious.     Blood pressure 123/82, pulse 90, temperature 98.3 F (36.8 C), temperature source Oral, resp. rate 17, height 5\' 4"  (1.626 m), weight 72.576 kg (160 lb), last menstrual period 11/17/2015, SpO2 100 %.Body mass index is 27.45 kg/(m^2).  General Appearance: Fairly Groomed  Patent attorneyye Contact::  Fair  Speech:  Clear and Coherent409  Volume:  Decreased  Mood:  Euthymic  Affect:  Appropriate  Thought Process:  Coherent and Goal Directed  Orientation:  Full (Time, Place, and Person)  Thought Content:  plans as she move on, relapse prevention plan  Suicidal Thoughts:  No  Homicidal Thoughts:  No  Memory:  Immediate;   Fair Recent;   Fair Remote;   Fair  Judgement:  Fair  Insight:  Present and Shallow  Psychomotor Activity:  Normal  Concentration:  Fair  Recall:  Fis[employer]Fair  Fund of Knowledge:Fair  Language: Fair  Akathisia:  No  Handed:  Right  AIMS (if indicated):     Assets:  Desire  for Improvement Housing Social Support  Sleep:  Number of Hours: 6  Cognition: WNL  ADL's:  Intact  In full contact with reality. There are no active S/S of withdrawal. There are no active SI plans or intent. She is willing and motivated to continue to work on long term abstinence pursuing  outpatient treatment  Mental Status Per Nursing Assessment::   On Admission:  NA  Demographic Factors:  Adolescent or young adult and Caucasian  Loss Factors: Legal issues  Historical Factors: none identified  Risk Reduction Factors:   Sense of responsibility to family and Positive social support  Continued Clinical Symptoms:  Depression:   Comorbid alcohol abuse/dependence  Cognitive Features That Contribute To Risk:  None    Suicide Risk:  Minimal: No identifiable suicidal ideation.  Patients presenting with no risk factors but with morbid ruminations; may be classified as minimal risk based on the severity of the depressive symptoms  Follow-up Information    Follow up with [facility] On 01/14/2016.   Why:  Appt on this date for medication management at 8:00AM. They will    Contact information:   [address] [place], [place] [address] Phone: [phone] Fax: [phone]      Plan Of Care/Follow-up recommendations:  Activity:  as tolerated Diet:  regular Follow up as above Kenika Sahm A, MD 12/23/2015, 2:12 PM
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
## 2015-12-23 NOTE — Progress Notes (Signed)
Discharge note:  Patient discharged home per MD order.   Patient received all personal belongings from locker and unit. Reviewed AVS/discharge instructions with patient.  [facility]  Patient will continue on her home medications.  She did not need any prescriptions or samples.  Patient left ambulatory with her father.  She denies SI/HI/AVH.  She left in good spirits.

## 2015-12-23 NOTE — [facility] Counselor (Signed)
Pt is discharging within 24 hours of admission. Psychosocial assessment not required.  Trula SladeHeather Smart, MSW, LCSW Clinical Social Worker 12/23/2015 10:30 AM

## 2015-12-23 NOTE — Progress Notes (Signed)
Report received from [facility].  Patient states she is fine and does not need anything at this time.  Patient states she wants to get some rest.  Will continue to monitor.
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
## 2016-08-15 ENCOUNTER — Emergency Department (HOSPITAL_COMMUNITY): Payer: Self-pay

## 2016-08-15 ENCOUNTER — Encounter (HOSPITAL_COMMUNITY): Payer: Self-pay | Admitting: Emergency Medicine

## 2016-08-15 ENCOUNTER — Emergency Department (HOSPITAL_COMMUNITY)
Admission: EM | Admit: 2016-08-15 | Discharge: 2016-08-15 | Disposition: A | Discharge status: Home / Self Care | Payer: Self-pay | Attending: Emergency Medicine | Admitting: Emergency Medicine

## 2016-08-15 DIAGNOSIS — S0083XA Contusion of other part of head, initial encounter: Secondary | ICD-10-CM

## 2016-08-15 DIAGNOSIS — S0101XA Laceration without foreign body of scalp, initial encounter: Secondary | ICD-10-CM | POA: Insufficient documentation

## 2016-08-15 DIAGNOSIS — F17210 Nicotine dependence, cigarettes, uncomplicated: Secondary | ICD-10-CM | POA: Insufficient documentation

## 2016-08-15 DIAGNOSIS — Y9339 Activity, other involving climbing, rappelling and jumping off: Secondary | ICD-10-CM | POA: Insufficient documentation

## 2016-08-15 DIAGNOSIS — S8002XA Contusion of left knee, initial encounter: Secondary | ICD-10-CM | POA: Insufficient documentation

## 2016-08-15 DIAGNOSIS — Y92524 Gas station as the place of occurrence of the external cause: Secondary | ICD-10-CM | POA: Insufficient documentation

## 2016-08-15 DIAGNOSIS — Y999 Unspecified external cause status: Secondary | ICD-10-CM | POA: Insufficient documentation

## 2016-08-15 HISTORY — DX: Depression, unspecified: F32.A

## 2016-08-15 HISTORY — DX: Major depressive disorder, single episode, unspecified: F32.9

## 2016-08-15 MED ORDER — HYDROCODONE-ACETAMINOPHEN 5-325 MG PO TABS
2.0000 | ORAL_TABLET | Freq: Once | ORAL | Status: AC
  Administered 2016-08-15: 2 via ORAL
  Filled 2016-08-15: qty 2

## 2016-08-15 MED ORDER — IBUPROFEN 400 MG PO TABS
600.0000 mg | ORAL_TABLET | Freq: Once | ORAL | Status: AC
  Administered 2016-08-15: 600 mg via ORAL
  Filled 2016-08-15: qty 1
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
## 2016-08-15 NOTE — Discharge Instructions (Signed)
Call police if you feel unsafe. Ibuprofen and tylenol with ice for pain.  If you were given medicines take as directed.  If you are on coumadin or contraceptives realize their levels and effectiveness is altered by many different medicines.  If you have any reaction (rash, tongues swelling, other) to the medicines stop taking and see a physician.    If your blood pressure was elevated in the ER make sure you follow up for management with a primary doctor or return for chest pain, shortness of breath or stroke symptoms.  Please follow up as directed and return to the ER or see a physician for new or worsening symptoms.  Thank you.

## 2017-02-08 ENCOUNTER — Inpatient Hospital Stay (HOSPITAL_COMMUNITY)
Admission: AD | Admit: 2017-02-08 | Discharge: 2017-02-08 | Disposition: A | Discharge status: Home / Self Care | Payer: Self-pay | Source: Ambulatory Visit | Attending: Family Medicine | Admitting: Family Medicine

## 2017-02-08 ENCOUNTER — Encounter (HOSPITAL_COMMUNITY): Payer: Self-pay | Admitting: *Deleted

## 2017-02-08 DIAGNOSIS — G4700 Insomnia, unspecified: Secondary | ICD-10-CM | POA: Insufficient documentation

## 2017-02-08 DIAGNOSIS — F419 Anxiety disorder, unspecified: Secondary | ICD-10-CM | POA: Insufficient documentation

## 2017-02-08 DIAGNOSIS — Z3202 Encounter for pregnancy test, result negative: Secondary | ICD-10-CM | POA: Insufficient documentation

## 2017-02-08 DIAGNOSIS — R112 Nausea with vomiting, unspecified: Secondary | ICD-10-CM

## 2017-02-08 DIAGNOSIS — F329 Major depressive disorder, single episode, unspecified: Secondary | ICD-10-CM | POA: Insufficient documentation

## 2017-02-08 DIAGNOSIS — F17210 Nicotine dependence, cigarettes, uncomplicated: Secondary | ICD-10-CM | POA: Insufficient documentation

## 2017-02-08 DIAGNOSIS — Z793 Long term (current) use of hormonal contraceptives: Secondary | ICD-10-CM | POA: Insufficient documentation

## 2017-02-08 DIAGNOSIS — Z975 Presence of (intrauterine) contraceptive device: Secondary | ICD-10-CM

## 2017-02-08 LAB — URINALYSIS, ROUTINE W REFLEX MICROSCOPIC
Bilirubin Urine: NEGATIVE
GLUCOSE, UA: NEGATIVE mg/dL
HGB URINE DIPSTICK: NEGATIVE
KETONES UR: NEGATIVE mg/dL
LEUKOCYTES UA: NEGATIVE
Nitrite: NEGATIVE
PROTEIN: NEGATIVE mg/dL
Specific Gravity, Urine: 1.023 (ref 1.005–1.030)
pH: 5 (ref 5.0–8.0)

## 2017-02-08 LAB — POCT PREGNANCY, URINE: PREG TEST UR: NEGATIVE

## 2017-02-08 NOTE — Discharge Instructions (Signed)
Preparing for Pregnancy If you are considering becoming pregnant, make an appointment to see your regular health care provider to learn how to prepare for a safe and healthy pregnancy (preconception care). During a preconception care visit, your health care provider will:  Do a complete physical exam, including a Pap test.  Take a complete medical history.  Give you information, answer your questions, and help you resolve problems.  Preconception checklist Medical history  Tell your health care provider about any current or past medical conditions. Your pregnancy or your ability to become pregnant may be affected by chronic conditions, such as diabetes, chronic hypertension, and thyroid problems.  Include your family's medical history as well as your partner's medical history.  Tell your health care provider about any history of STIs (sexually transmitted infections).These can affect your pregnancy. In some cases, they can be passed to your baby. Discuss any concerns that you have about STIs.  If indicated, discuss the benefits of genetic testing. This testing will show whether there are any genetic conditions that may be passed from you or your partner to your baby.  Tell your health care provider about: ? Any problems you have had with conception or pregnancy. ? Any medicines you take. These include vitamins, herbal supplements, and over-the-counter medicines. ? Your history of immunizations. Discuss any vaccinations that you may need.  Diet  Ask your health care provider what to include in a healthy diet that has a balance of nutrients. This is especially important when you are pregnant or preparing to become pregnant.  Ask your health care provider to help you reach a healthy weight before pregnancy. ? If you are overweight, you may be at higher risk for certain complications, such as high blood pressure, diabetes, and preterm birth. ? If you are underweight, you are more likely  to have a baby who has a low birth weight.  Lifestyle, work, and home  Let your health care provider know: ? About any lifestyle habits that you have, such as alcohol use, drug use, or smoking. ? About recreational activities that may put you at risk during pregnancy, such as downhill skiing and certain exercise programs. ? Tell your health care provider about any international travel, especially any travel to places with an active Zika virus outbreak. ? About harmful substances that you may be exposed to at work or at home. These include chemicals, pesticides, radiation, or even litter boxes. ? If you do not feel safe at home.  Mental health  Tell your health care provider about: ? Any history of mental health conditions, including feelings of depression, sadness, or anxiety. ? Any medicines that you take for a mental health condition. These include herbs and supplements.  Home instructions to prepare for pregnancy Lifestyle  Eat a balanced diet. This includes fresh fruits and vegetables, whole grains, lean meats, low-fat dairy products, healthy fats, and foods that are high in fiber. Ask to meet with a nutritionist or registered dietitian for assistance with meal planning and goals.  Get regular exercise. Try to be active for at least 30 minutes a day on most days of the week. Ask your health care provider which activities are safe during pregnancy.  Do not use any products that contain nicotine or tobacco, such as cigarettes and e-cigarettes. If you need help quitting, ask your health care provider.  Do not drink alcohol.  Do not take illegal drugs.  Maintain a healthy weight. Ask your health care provider what weight range is   right for you.  General instructions  Keep an accurate record of your menstrual periods. This makes it easier for your health care provider to determine your baby's due date.  Begin taking prenatal vitamins and folic acid supplements daily as directed by  your health care provider.  Manage any chronic conditions, such as high blood pressure and diabetes, as told by your health care provider. This is important.  How do I know that I am pregnant? You may be pregnant if you have been sexually active and you miss your period. Symptoms of early pregnancy include:  Mild cramping.  Very light vaginal bleeding (spotting).  Feeling unusually tired.  Nausea and vomiting (morning sickness).  If you have any of these symptoms and you suspect that you might be pregnant, you can take a home pregnancy test. These tests check for a hormone in your urine (human chorionic gonadotropin, or hCG). A woman's body begins to make this hormone during early pregnancy. These tests are very accurate. Wait until at least the first day after you miss your period to take one. If the test shows that you are pregnant (you get a positive result), call your health care provider to make an appointment for prenatal care. What should I do if I become pregnant?  Make an appointment with your health care provider as soon as you suspect you are pregnant.  Do not use any products that contain nicotine, such as cigarettes, chewing tobacco, and e-cigarettes. If you need help quitting, ask your health care provider.  Do not drink alcoholic beverages. Alcohol is related to a number of birth defects.  Avoid toxic odors and chemicals.  You may continue to have sexual intercourse if it does not cause pain or other problems, such as vaginal bleeding. This information is not intended to replace advice given to you by your health care provider. Make sure you discuss any questions you have with your health care provider. Document Released: 07/22/2008 Document Revised: 04/06/2016 Document Reviewed: 02/29/2016 Elsevier Interactive Patient Education  2017 [employer] Inc.  

## 2017-02-08 NOTE — MAU Provider Note (Signed)
Chief Complaint: Constipation; Insomnia; Nausea; Emesis; Abdominal Cramping; and Possible Pregnancy   First Provider Initiated Contact with Patient 02/08/17 1130     SUBJECTIVE HPI: Erin Arroyo is a 21 y.o. female who presents to [facility] Admissions reporting pregnancy Sx w/ Nexplanon in place. Neg home UPT 2 weeks ago.   Reports N/V, fatigue, mild cramping like before periods.  Duration: 2 weeks Modifying factors: Hasn't tried anything for pain or nausea.   Past Medical History:  Diagnosis Date  . Anxiety   . Depression   . Heart palpitations   . Vision abnormalities    OB History  No data available   Past Surgical History:  Procedure Laterality Date  . APPENDECTOMY  Age 89   Social History   Social History  . Marital status: Single    Spouse name: N/A  . Number of children: N/A  . Years of education: N/A   Occupational History  . Student     11th grade at [employer]   Social History Main Topics  . Smoking status: Current Every Day Smoker    Packs/day: 0.50    Types: Cigarettes    Last attempt to quit: 08/14/2012  . Smokeless tobacco: Never Used  . Alcohol use Yes     Comment: report 2 beers today  . Drug use: Yes    Types: Marijuana, Amphetamines, Cocaine, Benzodiazepines     Comment: last THC use Dec 2014, last amphet 09/13/12  . Sexual activity: No   Other Topics Concern  . Not on file   Social History Narrative  . No narrative on file   Family History  Problem Relation Age of Onset  . Anxiety disorder Mother   . Depression Mother   . Anxiety disorder Brother   . Depression Brother   . Depression Father    No current facility-administered medications on file prior to encounter.    Current Outpatient Prescriptions on File Prior to Encounter  Medication Sig Dispense Refill  . Brexpiprazole (REXULTI) 2 MG TABS Take 2 mg by mouth every evening. For antidepressant augmentation. 1 tablet 0  . etonogestrel (NEXPLANON) 68 MG IMPL implant 1 each (68 mg  total) by Subdermal route once. Implanted 12/17/2015: Birth control method 1 each 0  . sertraline (ZOLOFT) 100 MG tablet Take 2 tablets (200 mg total) by mouth daily. For depression     Allergies  Allergen Reactions  . Cat Hair Extract Itching    sneezing  . Dust Mite Extract Itching    I have reviewed patient's Past Medical Hx, Surgical Hx, Family Hx, Social Hx, medications and allergies.   Review of Systems  Constitutional: Positive for fatigue. Negative for chills and fever.  Gastrointestinal: Positive for abdominal pain, nausea and vomiting.  Genitourinary: Negative for vaginal bleeding and vaginal discharge.    OBJECTIVE Patient Vitals for the past 24 hrs:  BP Temp Temp src Pulse Resp SpO2 Weight  02/08/17 1146 117/82 - - 66 18 100 % -  02/08/17 0951 128/74 98 F (36.7 C) Oral 83 18 100 % 179 lb 0.6 oz (81.2 kg)   Constitutional: Well-developed, well-nourished female in no acute distress.  Cardiovascular: normal rate Respiratory: normal rate and effort.  GI: Deferred Neurologic: Alert and oriented x 4.  GU: Deferred  LAB RESULTS Results for orders placed or performed during the hospital encounter of 02/08/17 (from the past 24 hour(s))  Urinalysis, Routine w reflex microscopic     Status: None   Collection Time: 02/08/17  9:46 AM  Result Value Ref Range   Color, Urine YELLOW YELLOW   APPearance CLEAR CLEAR   Specific Gravity, Urine 1.023 1.005 - 1.030   pH 5.0 5.0 - 8.0   Glucose, UA NEGATIVE NEGATIVE mg/dL   Hgb urine dipstick NEGATIVE NEGATIVE   Bilirubin Urine NEGATIVE NEGATIVE   Ketones, ur NEGATIVE NEGATIVE mg/dL   Protein, ur NEGATIVE NEGATIVE mg/dL   Nitrite NEGATIVE NEGATIVE   Leukocytes, UA NEGATIVE NEGATIVE  Pregnancy, urine POC     Status: None   Collection Time: 02/08/17 10:02 AM  Result Value Ref Range   Preg Test, Ur NEGATIVE NEGATIVE    IMAGING No results found.  MAU COURSE Orders Placed This Encounter  Procedures  . Urinalysis, Routine w  reflex microscopic  . Pregnancy, urine POC  . Discharge patient    MDM - Neg UPT in MAU as well. No evidence of emergent condition. Pt would like to have Nexplanon removed to TTC, but can't afford removal fee at [facility].   ASSESSMENT 1. Pregnancy examination or test, negative result   2. Nexplanon in place     PLAN Discharge home in stable condition. Start Folic acid.  Follow-up Information    [facility] Follow up on 02/09/2017.   Specialty:  Obstetrics and Gynecology Why:  at 9:30 for appointment and to discuss financial assistance Contact information: [address] [place] [address] [phone]         Allergies as of 02/08/2017      Reactions   Cat Hair Extract Itching   sneezing   Dust Mite Extract Itching      Medication List    TAKE these medications   Brexpiprazole 2 MG Tabs Commonly known as:  REXULTI Take 2 mg by mouth every evening. For antidepressant augmentation.   etonogestrel 68 MG Impl implant Commonly known as:  NEXPLANON 1 each (68 mg total) by Subdermal route once. Implanted 12/17/2015: Birth control method   sertraline 100 MG tablet Commonly known as:  ZOLOFT Take 2 tablets (200 mg total) by mouth daily. For depression        Katrinka Blazing [place], [place] 02/08/2017  11:48 AM

## 2017-02-08 NOTE — MAU Note (Signed)
Has implant birth control  Expresses concern for possible pregnancy because feeling the following symptoms:  +constipation Last BM " a couple days ago"  +insomnia +tired +nausea/vomiting  +lower abdominal cramp menstrual like Rating pain 5-6/10 Has not taken anything for the pain  Unsure LMP; states irregular Negative HPT 2 weeks ago  Noticed symptoms for past 1-2 weeks.  Denies vaginal bleeding or discharge.
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
## 2017-06-01 ENCOUNTER — Emergency Department (HOSPITAL_COMMUNITY)
Admission: EM | Admit: 2017-06-01 | Discharge: 2017-06-01 | Disposition: A | Discharge status: Home / Self Care | Payer: Self-pay | Attending: Emergency Medicine | Admitting: Emergency Medicine

## 2017-06-01 ENCOUNTER — Encounter (HOSPITAL_COMMUNITY): Payer: Self-pay | Admitting: Emergency Medicine

## 2017-06-01 DIAGNOSIS — R1030 Lower abdominal pain, unspecified: Secondary | ICD-10-CM | POA: Insufficient documentation

## 2017-06-01 DIAGNOSIS — Z79899 Other long term (current) drug therapy: Secondary | ICD-10-CM | POA: Insufficient documentation

## 2017-06-01 DIAGNOSIS — F419 Anxiety disorder, unspecified: Secondary | ICD-10-CM | POA: Insufficient documentation

## 2017-06-01 DIAGNOSIS — R102 Pelvic and perineal pain: Secondary | ICD-10-CM

## 2017-06-01 DIAGNOSIS — F1910 Other psychoactive substance abuse, uncomplicated: Secondary | ICD-10-CM | POA: Insufficient documentation

## 2017-06-01 DIAGNOSIS — N390 Urinary tract infection, site not specified: Secondary | ICD-10-CM | POA: Insufficient documentation

## 2017-06-01 DIAGNOSIS — F17210 Nicotine dependence, cigarettes, uncomplicated: Secondary | ICD-10-CM | POA: Insufficient documentation

## 2017-06-01 DIAGNOSIS — R110 Nausea: Secondary | ICD-10-CM | POA: Insufficient documentation

## 2017-06-01 DIAGNOSIS — F329 Major depressive disorder, single episode, unspecified: Secondary | ICD-10-CM | POA: Insufficient documentation

## 2017-06-01 LAB — COMPREHENSIVE METABOLIC PANEL
ALBUMIN: 3.7 g/dL (ref 3.5–5.0)
ALT: 18 U/L (ref 14–54)
ANION GAP: 9 (ref 5–15)
AST: 20 U/L (ref 15–41)
Alkaline Phosphatase: 72 U/L (ref 38–126)
BILIRUBIN TOTAL: 0.8 mg/dL (ref 0.3–1.2)
BUN: 6 mg/dL (ref 6–20)
CHLORIDE: 109 mmol/L (ref 101–111)
CO2: 19 mmol/L — AB (ref 22–32)
Calcium: 9 mg/dL (ref 8.9–10.3)
Creatinine, Ser: 0.7 mg/dL (ref 0.44–1.00)
GFR calc Af Amer: >60 mL/min (ref >60)
GFR calc non Af Amer: >60 mL/min (ref >60)
GLUCOSE: 80 mg/dL (ref 65–99)
POTASSIUM: 3.8 mmol/L (ref 3.5–5.1)
SODIUM: 137 mmol/L (ref 135–145)
TOTAL PROTEIN: 7.1 g/dL (ref 6.5–8.1)

## 2017-06-01 LAB — CBC
HEMATOCRIT: 43.9 % (ref 36.0–46.0)
HEMOGLOBIN: 15.3 g/dL — AB (ref 12.0–15.0)
MCH: 30.5 pg (ref 26.0–34.0)
MCHC: 34.9 g/dL (ref 30.0–36.0)
MCV: 87.6 fL (ref 78.0–100.0)
Platelets: 323 10*3/uL (ref 150–400)
RBC: 5.01 MIL/uL (ref 3.87–5.11)
RDW: 12.9 % (ref 11.5–15.5)
WBC: 9.9 10*3/uL (ref 4.0–10.5)

## 2017-06-01 LAB — URINALYSIS, ROUTINE W REFLEX MICROSCOPIC
BILIRUBIN URINE: NEGATIVE
Glucose, UA: NEGATIVE mg/dL
HGB URINE DIPSTICK: NEGATIVE
Ketones, ur: NEGATIVE mg/dL
NITRITE: NEGATIVE
PROTEIN: NEGATIVE mg/dL
Specific Gravity, Urine: 1.016 (ref 1.005–1.030)
pH: 5 (ref 5.0–8.0)

## 2017-06-01 LAB — POC URINE PREG, ED: PREG TEST UR: NEGATIVE

## 2017-06-01 LAB — WET PREP, GENITAL
Sperm: NONE SEEN
Trich, Wet Prep: NONE SEEN
Yeast Wet Prep HPF POC: NONE SEEN

## 2017-06-01 LAB — LIPASE, BLOOD: LIPASE: 19 U/L (ref 11–51)

## 2017-06-01 MED ORDER — CEPHALEXIN 500 MG PO CAPS: 500.0000 mg | ORAL_CAPSULE | Freq: Three times a day (TID) | ORAL | 0 refills | Status: AC

## 2017-06-01 MED ORDER — AZITHROMYCIN 250 MG PO TABS
1000.0000 mg | ORAL_TABLET | Freq: Once | ORAL | Status: AC
Start: 2017-06-01 — End: 2017-06-01
  Administered 2017-06-01: 1000 mg via ORAL
  Filled 2017-06-01: qty 4

## 2017-06-01 MED ORDER — CEFTRIAXONE SODIUM 250 MG IJ SOLR
250.0000 mg | Freq: Once | INTRAMUSCULAR | Status: AC
  Administered 2017-06-01: 250 mg via INTRAMUSCULAR
  Filled 2017-06-01: qty 250

## 2017-06-01 MED ORDER — ONDANSETRON 4 MG PO TBDP: 4.0000 mg | ORAL_TABLET | Freq: Three times a day (TID) | ORAL | 0 refills | Status: AC | PRN

## 2017-06-01 MED ORDER — LIDOCAINE HCL (PF) 1 % IJ SOLN
INTRAMUSCULAR | Status: AC
  Administered 2017-06-01: 2.1 mL
  Filled 2017-06-01: qty 5

## 2017-06-01 NOTE — ED Notes (Signed)
ED Provider at bedside. 

## 2017-06-01 NOTE — ED Triage Notes (Signed)
Pt states for the last week every morning she wakes up with lower abd pain and after eating has nausea.

## 2017-06-01 NOTE — Discharge Instructions (Signed)
Recommend hydration, fiber, and miralax 1-2 caps daily for constipation.
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
## 2017-06-02 LAB — GC/CHLAMYDIA PROBE AMP (~~LOC~~) NOT AT ARMC
CHLAMYDIA, DNA PROBE: POSITIVE — AB
NEISSERIA GONORRHEA: NEGATIVE

## 2017-06-03 LAB — URINE CULTURE

## 2017-06-09 ENCOUNTER — Encounter (HOSPITAL_COMMUNITY): Payer: Self-pay | Admitting: *Deleted

## 2017-06-09 ENCOUNTER — Ambulatory Visit (HOSPITAL_COMMUNITY)
Admission: EM | Admit: 2017-06-09 | Discharge: 2017-06-09 | Disposition: A | Discharge status: Home / Self Care | Payer: Self-pay | Attending: Emergency Medicine | Admitting: Emergency Medicine

## 2017-06-09 DIAGNOSIS — R3915 Urgency of urination: Secondary | ICD-10-CM | POA: Insufficient documentation

## 2017-06-09 DIAGNOSIS — R350 Frequency of micturition: Secondary | ICD-10-CM | POA: Insufficient documentation

## 2017-06-09 DIAGNOSIS — F17210 Nicotine dependence, cigarettes, uncomplicated: Secondary | ICD-10-CM | POA: Insufficient documentation

## 2017-06-09 DIAGNOSIS — Z79899 Other long term (current) drug therapy: Secondary | ICD-10-CM | POA: Insufficient documentation

## 2017-06-09 DIAGNOSIS — F329 Major depressive disorder, single episode, unspecified: Secondary | ICD-10-CM | POA: Insufficient documentation

## 2017-06-09 DIAGNOSIS — R300 Dysuria: Secondary | ICD-10-CM | POA: Insufficient documentation

## 2017-06-09 DIAGNOSIS — F1910 Other psychoactive substance abuse, uncomplicated: Secondary | ICD-10-CM | POA: Insufficient documentation

## 2017-06-09 DIAGNOSIS — Z87440 Personal history of urinary (tract) infections: Secondary | ICD-10-CM | POA: Insufficient documentation

## 2017-06-09 DIAGNOSIS — R102 Pelvic and perineal pain: Secondary | ICD-10-CM | POA: Insufficient documentation

## 2017-06-09 DIAGNOSIS — F411 Generalized anxiety disorder: Secondary | ICD-10-CM | POA: Insufficient documentation

## 2017-06-09 DIAGNOSIS — Z7982 Long term (current) use of aspirin: Secondary | ICD-10-CM | POA: Insufficient documentation

## 2017-06-09 LAB — POCT URINALYSIS DIP (DEVICE)
Bilirubin Urine: NEGATIVE
GLUCOSE, UA: NEGATIVE mg/dL
KETONES UR: NEGATIVE mg/dL
Leukocytes, UA: NEGATIVE
Nitrite: NEGATIVE
PH: 5.5 (ref 5.0–8.0)
PROTEIN: NEGATIVE mg/dL
UROBILINOGEN UA: 0.2 mg/dL (ref 0.0–1.0)

## 2017-06-09 MED ORDER — PHENAZOPYRIDINE HCL 200 MG PO TABS: 200.0000 mg | ORAL_TABLET | Freq: Three times a day (TID) | ORAL | 0 refills | Status: AC

## 2017-06-09 NOTE — Discharge Instructions (Signed)
Increase hydration.Continue and complete course of Keflex. Abstain from sexual encounter

## 2017-06-09 NOTE — ED Triage Notes (Addendum)
Patient reports she was seen at the ED and dx with UTI and chlamydia. Patient was treated for both however states she still feels like she has a UTI. Reports dysuria, polyuria, and abdominal pain.   Patient is currently on cephalexin, report diarrhea.

## 2017-06-09 NOTE — ED Provider Notes (Signed)
[facility]    CSN: [id number] Arrival date & time: 06/09/17  1230     History   Chief Complaint Chief Complaint  Patient presents with  . Dysuria  . Polyuria  . Abdominal Pain    HPI Erin Arroyo is a 21 y.o. female presented with CC of suprapubic pain, pressure, urgency and frequency. Pt were seen in ED with similar Sx and were Dz with UTI and Tx with Keflex. Also Tx with Ceftriaxone 250 mg and Azithromycin based on discharge on pelvic exam. Pt states Sx have not improved since the TX. Still have suprapubic pressure with pelvic pain and urinary urgency and frequency. Pt denies vaginal discharge or lesions. Pt states had sexual encounter with same partner after she was discharged from hospital. Partner got tested and Tx for same on Monday. Urine dipstick negative for Infection. Hematuria on dipstick as pt on her menstrual period day 1.   The history is provided by the patient.  Dysuria  Pain quality:  Burning Pain severity:  Mild Onset quality:  Unable to specify Duration:  10 days Timing:  Constant Progression:  Unchanged Chronicity:  Recurrent Relieved by:  Nothing Associated symptoms: abdominal pain   Abdominal Pain  Associated symptoms: dysuria     Past Medical History:  Diagnosis Date  . Anxiety   . Depression   . Heart palpitations   . Vision abnormalities     Patient Active Problem List   Diagnosis Date Noted  . MDD (major depressive disorder), recurrent episode, severe (HCC) 12/22/2015  . Depressive disorder 09/14/2012  . GAD (generalized anxiety disorder) 09/14/2012  . Polysubstance abuse (HCC) 09/14/2012    Past Surgical History:  Procedure Laterality Date  . APPENDECTOMY  Age 9    OB History    No data available       Home Medications    Prior to Admission medications   Medication Sig Start Date End Date Taking? Authorizing Provider  etonogestrel (NEXPLANON) 68 MG IMPL implant 1 each (68 mg total) by Subdermal route once.  Implanted 12/17/2015: Birth control method 12/23/15  Yes Armandina Stammer I, NP  aspirin EC 325 MG tablet Take 325 mg by mouth as needed for mild pain.    [provider]  Brexpiprazole (REXULTI) 2 MG TABS Take 2 mg by mouth every evening. For antidepressant augmentation. Patient not taking: Reported on 06/01/2017 12/23/15   Armandina Stammer I, NP  ondansetron (ZOFRAN ODT) 4 MG disintegrating tablet Take 1 tablet (4 mg total) by mouth every 8 (eight) hours as needed for nausea or vomiting. 06/01/17   Alvira Monday, MD  phenazopyridine (PYRIDIUM) 200 MG tablet Take 1 tablet (200 mg total) by mouth 3 (three) times daily. 06/09/17   Tab Rylee, NP  sertraline (ZOLOFT) 100 MG tablet Take 2 tablets (200 mg total) by mouth daily. For depression Patient not taking: Reported on 06/01/2017 12/23/15   Sanjuana Kava, NP    Family History Family History  Problem Relation Age of Onset  . Anxiety disorder Mother   . Depression Mother   . Anxiety disorder Brother   . Depression Brother   . Depression Father     Social History Social History  Substance Use Topics  . Smoking status: Current Every Day Smoker    Packs/day: 0.50    Types: Cigarettes    Last attempt to quit: 08/14/2012  . Smokeless tobacco: Never Used  . Alcohol use Yes     Comment: report 2 beers today  Allergies   Cat hair extract and Dust mite extract   Review of Systems Review of Systems  Constitutional: Negative.   HENT: Negative.   Eyes: Negative.   Respiratory: Negative.   Cardiovascular: Negative.   Gastrointestinal: Positive for abdominal pain.  Genitourinary: Positive for dysuria.  Neurological: Negative.   Psychiatric/Behavioral: Negative.      Physical Exam Triage Vital Signs ED Triage Vitals [06/09/17 1336]  Enc Vitals Group     BP      Pulse      Resp      Temp      Temp src      SpO2      Weight      Height      Head Circumference      Peak Flow      Pain Score 3     Pain Loc       Pain Edu?      Excl. in GC?    No data found.   Updated Vital Signs There were no vitals taken for this visit.  Visual Acuity Right Eye Distance:   Left Eye Distance:   Bilateral Distance:    Right Eye Near:   Left Eye Near:    Bilateral Near:     Physical Exam  Constitutional: She appears well-developed and well-nourished. She appears distressed.  HENT:  Head: Normocephalic.  Eyes: Pupils are equal, round, and reactive to light.  Cardiovascular: Normal rate and regular rhythm.   Pulmonary/Chest: Effort normal and breath sounds normal.  Abdominal: Soft. Bowel sounds are normal. She exhibits no distension and no mass. There is no tenderness. There is no rebound and no guarding.  No abdominal pain to light or deep palpation. Mild suprapubic discomfort to deep palpation.  Skin: She is not diaphoretic.     UC Treatments / Results  Labs (all labs ordered are listed, but only abnormal results are displayed) Labs Reviewed  POCT URINALYSIS DIP (DEVICE) - Abnormal; Notable for the following:       Result Value   Hgb urine dipstick LARGE (*)    All other components within normal limits  URINE CULTURE  URINE CYTOLOGY ANCILLARY ONLY    EKG  EKG Interpretation None       Radiology No results found.  Procedures Procedures (including critical care time)  Medications Ordered in UC Medications - No data to display   Initial Impression / Assessment and Plan / UC Course  I have reviewed the triage vital signs and the nursing notes.  Pertinent labs & imaging results that were available during my care of the patient were reviewed by me and considered in my medical decision making (see chart for details).    Pt had unprotected sexual encounter after she was discharged from hospital. Likelihood she contracted STI from partner. Will test for GC/Chlamydia and Trich. And send urine for culture. Educated not to engage in sexual activity until cleared with lab result. Pt  verbalizes understanding. Recommended to continue and complete course of Keflex as prescribed in ED.   Final Clinical Impressions(s) / UC Diagnoses   Final diagnoses:  Urinary urgency  Dysuria  Abdominal pain, suprapubic    New Prescriptions New Prescriptions   PHENAZOPYRIDINE (PYRIDIUM) 200 MG TABLET    Take 1 tablet (200 mg total) by mouth 3 (three) times daily.     Controlled Substance Prescriptions [place] Controlled Substance Registry consulted? Not Applicable   Reinaldo RaddleMultani, Jaunice Mirza, NP 06/09/17 1422

## 2017-06-10 LAB — URINE CYTOLOGY ANCILLARY ONLY
CHLAMYDIA, DNA PROBE: NEGATIVE
NEISSERIA GONORRHEA: NEGATIVE
TRICH (WINDOWPATH): NEGATIVE

## 2017-06-13 ENCOUNTER — Telehealth (HOSPITAL_COMMUNITY): Payer: Self-pay | Admitting: Emergency Medicine

## 2017-06-13 LAB — URINE CULTURE

## 2017-06-13 NOTE — Telephone Encounter (Signed)
Urine culture  Order: [id number]  Status:  Preliminary result Visible to patient:  No (Not Released) Next appt:  None  Notes recorded by Eustace MooreMurray, Laura W, MD on 06/12/2017 at 10:17 AM EDT Await final sensitivities. LM  Component 4d ago  Specimen Description URINE, CLEAN CATCH   Special Requests NONE   Culture   >=100,000 COLONIES/mL ENTEROBACTER CLOACAE  REPEATING SENSITIVITIES      Report Status PENDING   Resulting Agency [facility]    Specimen Collected: 06/09/17 14:16 Last Resulted: 06/12/17 09:21              Lab calling to report abnormal reading from above.

## 2017-06-14 ENCOUNTER — Telehealth (HOSPITAL_COMMUNITY): Payer: Self-pay | Admitting: Internal Medicine

## 2017-06-14 MED ORDER — SULFAMETHOXAZOLE-TRIMETHOPRIM 800-160 MG PO TABS: 1.0000 | ORAL_TABLET | Freq: Two times a day (BID) | ORAL | 0 refills | Status: DC

## 2017-06-14 NOTE — Telephone Encounter (Signed)
Clinical staff, please let patient know that urine culture was positive for an Enterobacter germ, resistant to several medications but sensitive to trimethoprim/sulfa.  Rx trimethoprim/sulfa sent to the pharmacy of record, [facility] [place].  Take all of the medicine.  Recheck for further evaluation if symptoms are not improving.  LM

## 2017-06-16 ENCOUNTER — Telehealth (HOSPITAL_COMMUNITY): Payer: Self-pay | Admitting: Emergency Medicine

## 2017-06-16 MED ORDER — SULFAMETHOXAZOLE-TRIMETHOPRIM 800-160 MG PO TABS: 1.0000 | ORAL_TABLET | Freq: Two times a day (BID) | ORAL | 0 refills | Status: AC

## 2017-06-16 NOTE — Telephone Encounter (Signed)
Called pt to notify of recent lab results.... Pt ID'd properly Reports feeling better and sx are subsiding Notified of pending Rx... Wanted me to re-call Bactrim in to [facility] ([address])... [facility]  Adv pt if sx are not getting better to return or to f/u w/PCP Education on safe sex given Notified pt that lab results can be obtained through MyChart Pt verb understanding.

## 2017-06-16 NOTE — Telephone Encounter (Signed)
-----   Message from Eustace MooreLaura W Murray, MD sent at 06/14/2017  7:35 AM EDT ----- Clinical staff, please let patient know that urine culture was positive for an Enterobacter germ, resistant to several medications but sensitive to trimethoprim/sulfa.   Rx trimethoprim/sulfa sent to the pharmacy of record, [facility] [place].  Take all of the medicine.  Recheck for further evaluation if symptoms are not improving.  LM

## 2017-07-11 ENCOUNTER — Ambulatory Visit (HOSPITAL_COMMUNITY)
Admission: EM | Admit: 2017-07-11 | Discharge: 2017-07-11 | Disposition: A | Discharge status: Home / Self Care | Payer: Self-pay | Attending: Family Medicine | Admitting: Family Medicine

## 2017-07-11 ENCOUNTER — Encounter (HOSPITAL_COMMUNITY): Payer: Self-pay | Admitting: Emergency Medicine

## 2017-07-11 DIAGNOSIS — N390 Urinary tract infection, site not specified: Secondary | ICD-10-CM

## 2017-07-11 DIAGNOSIS — R350 Frequency of micturition: Secondary | ICD-10-CM

## 2017-07-11 DIAGNOSIS — Z3202 Encounter for pregnancy test, result negative: Secondary | ICD-10-CM

## 2017-07-11 LAB — GLUCOSE, CAPILLARY: GLUCOSE-CAPILLARY: 108 mg/dL — AB (ref 65–99)

## 2017-07-11 LAB — POCT URINALYSIS DIP (DEVICE)
BILIRUBIN URINE: NEGATIVE
Glucose, UA: NEGATIVE mg/dL
Hgb urine dipstick: NEGATIVE
Ketones, ur: NEGATIVE mg/dL
LEUKOCYTES UA: NEGATIVE
NITRITE: NEGATIVE
Protein, ur: NEGATIVE mg/dL
Specific Gravity, Urine: 1.02 (ref 1.005–1.030)
Urobilinogen, UA: 0.2 mg/dL (ref 0.0–1.0)
pH: 6.5 (ref 5.0–8.0)

## 2017-07-11 LAB — POCT PREGNANCY, URINE: PREG TEST UR: NEGATIVE

## 2017-07-11 MED ORDER — CIPROFLOXACIN HCL 500 MG PO TABS
500.0000 mg | ORAL_TABLET | Freq: Two times a day (BID) | ORAL | 0 refills | Status: AC
Start: 2017-07-11 — End: 2017-07-16

## 2017-07-11 NOTE — ED Triage Notes (Signed)
PT reports she was seen in the ER a few weeks ago for a UTI and took a course of antibiotics. PT follow up here a week later, still tested positive for UTI and took another anitbiotic course. PT reports symptoms returned 1.5 weeks ago. PT reports symptoms do resolve after antibiotics, but infection returns. PT reports she is not sexually active.

## 2017-07-11 NOTE — ED Provider Notes (Signed)
[facility]    CSN: [id number] Arrival date & time: 07/11/17  1249     History   Chief Complaint Chief Complaint  Patient presents with  . Recurrent UTI    HPI Erin Arroyo is a 21 y.o. female coming for recurrent UTI symptoms. She continues to have UTI symptoms of suprapubic discomfort, frequency, urgency and incomplete voiding. She was seen in the ED on 10/10 for similar symptoms and freated for chlamydia/gonorrhea and a UTI with Keflex. On 10/18 she presented here as her symptoms failed to resolve. UA showed negative leuks and nitrites but culture grew out resistant enertobacter species resistant to Keflex, ceftriaxoine, nitrofurantoin. Sensitive to Bactrim, cipro, and gentamicin. She was given Bactrim for 5 days. Her symptoms resolved for about 1 week, but then returned. She has refrained from sex since she last visited. She has been using cranberry supplements.   HPI  Past Medical History:  Diagnosis Date  . Anxiety   . Depression   . Heart palpitations   . Vision abnormalities     Patient Active Problem List   Diagnosis Date Noted  . MDD (major depressive disorder), recurrent episode, severe (HCC) 12/22/2015  . Depressive disorder 09/14/2012  . GAD (generalized anxiety disorder) 09/14/2012  . Polysubstance abuse (HCC) 09/14/2012    Past Surgical History:  Procedure Laterality Date  . APPENDECTOMY  Age 62    OB History    No data available       Home Medications    Prior to Admission medications   Medication Sig Start Date End Date Taking? Authorizing Provider  aspirin EC 325 MG tablet Take 325 mg by mouth as needed for mild pain.    [provider]  Brexpiprazole (REXULTI) 2 MG TABS Take 2 mg by mouth every evening. For antidepressant augmentation. Patient not taking: Reported on 06/01/2017 12/23/15   Armandina StammerNwoko, Agnes I, NP  ciprofloxacin (CIPRO) 500 MG tablet Take 1 tablet (500 mg total) every 12 (twelve) hours for 5 days by mouth. 07/11/17  07/16/17  Matin Mattioli C, PA-C  etonogestrel (NEXPLANON) 68 MG IMPL implant 1 each (68 mg total) by Subdermal route once. Implanted 12/17/2015: Birth control method 12/23/15   Armandina StammerNwoko, Agnes I, NP  ondansetron (ZOFRAN ODT) 4 MG disintegrating tablet Take 1 tablet (4 mg total) by mouth every 8 (eight) hours as needed for nausea or vomiting. 06/01/17   Alvira MondaySchlossman, Erin, MD  phenazopyridine (PYRIDIUM) 200 MG tablet Take 1 tablet (200 mg total) by mouth 3 (three) times daily. 06/09/17   Multani, Bhupinder, NP  sertraline (ZOLOFT) 100 MG tablet Take 2 tablets (200 mg total) by mouth daily. For depression Patient not taking: Reported on 06/01/2017 12/23/15   Sanjuana KavaNwoko, Agnes I, NP    Family History Family History  Problem Relation Age of Onset  . Anxiety disorder Mother   . Depression Mother   . Anxiety disorder Brother   . Depression Brother   . Depression Father   Denies family history of DM.   Social History Social History   Tobacco Use  . Smoking status: Current Every Day Smoker    Packs/day: 0.20    Types: Cigarettes    Last attempt to quit: 08/14/2012    Years since quitting: 4.9  . Smokeless tobacco: Never Used  Substance Use Topics  . Alcohol use: Yes    Comment: report 2 beers today  . Drug use: Yes    Types: Marijuana, Amphetamines, Benzodiazepines    Comment: last THC use Dec 2014,  last amphet 09/13/12     Allergies   Cat hair extract and Dust mite extract   Review of Systems Review of Systems  Constitutional: Negative for fatigue and fever.  Gastrointestinal: Negative for blood in stool, diarrhea, nausea and vomiting.  Genitourinary: Positive for difficulty urinating, frequency and urgency. Negative for dysuria, vaginal bleeding and vaginal discharge.       Positive for incomplete voiding  Musculoskeletal: Negative for back pain.     Physical Exam Triage Vital Signs ED Triage Vitals [07/11/17 1355]  Enc Vitals Group     BP 133/89     Pulse Rate 86     Resp 16      Temp 98.9 F (37.2 C)     Temp src      SpO2 100 %     Weight 170 lb (77.1 kg)     Height 5\' 4"  (1.626 m)     Head Circumference      Peak Flow      Pain Score 4     Pain Loc      Pain Edu?      Excl. in GC?    No data found.  Updated Vital Signs BP 133/89   Pulse 86   Temp 98.9 F (37.2 C)   Resp 16   Ht 5\' 4"  (1.626 m)   Wt 170 lb (77.1 kg)   LMP 06/20/2017   SpO2 100%   BMI 29.18 kg/m    Physical Exam  Constitutional: She appears well-developed and well-nourished.  HENT:  Head: Normocephalic.  Cardiovascular: Normal rate and regular rhythm.  Pulmonary/Chest: Effort normal and breath sounds normal.  Abdominal: Soft. There is tenderness. There is no rebound and no guarding.  Mild tenderness to palpation of LLQ and RLQ, suprapubic area    Genitourinary:  Genitourinary Comments: Pelvic exam deferred     UC Treatments / Results  Labs (all labs ordered are listed, but only abnormal results are displayed) Labs Reviewed  GLUCOSE, CAPILLARY - Abnormal; Notable for the following components:      Result Value   Glucose-Capillary 108 (*)    All other components within normal limits  POCT URINALYSIS DIP (DEVICE)  POCT PREGNANCY, URINE   Patient noted to have eaten [employer] Pancakes before coming in.  EKG  EKG Interpretation None       Radiology No results found.  Procedures Procedures (including critical care time)  Medications Ordered in UC Medications - No data to display   Initial Impression / Assessment and Plan / UC Course  I have reviewed the triage vital signs and the nursing notes.  Pertinent labs & imaging results that were available during my care of the patient were reviewed by me and considered in my medical decision making (see chart for details).    Patient appears to be having recurrent UTI symptoms. Her UA today was negative for leukocytes and nitrites, but it was also negative at her last visit and grew bacteria on the culture.  As she previously tried bactrim, I have prescribed her ciprofloxacin to try for 5 days. Encouraged front-to-back wiping, staying hydrated. She can use over the counter azo for any urinary discomfort.   Patient tested negative for GC/Chlamydia/Trich at last visit, and denies sexual intercourse since;  additional sti testing not tested today.  Final Clinical Impressions(s) / UC Diagnoses   Final diagnoses:  Recurrent UTI    ED Discharge Orders        Ordered    ciprofloxacin (  CIPRO) 500 MG tablet  Every 12 hours     07/11/17 1447       Controlled Substance Prescriptions [place] Controlled Substance Registry consulted? Not Applicable   Lew DawesWieters, Dorthia Tout C, [place]PA-C 07/11/17 [id number]

## 2017-07-11 NOTE — Discharge Instructions (Signed)
Prescription sent in for Cipro. Take 1 tablet twice a day for 5 days.   Wipe front to back, drink plenty of water. May use over the counter "azo" for urinary discomfort and continue cranberry supplements.  If symptoms return, please return to the urgent care. [facility] by a Urologist.

## 2017-07-27 ENCOUNTER — Encounter (HOSPITAL_COMMUNITY): Payer: Self-pay | Admitting: Family Medicine

## 2017-07-27 ENCOUNTER — Ambulatory Visit (HOSPITAL_COMMUNITY)
Admission: EM | Admit: 2017-07-27 | Discharge: 2017-07-27 | Disposition: A | Discharge status: Home / Self Care | Payer: Self-pay | Attending: Family Medicine | Admitting: Family Medicine

## 2017-07-27 ENCOUNTER — Other Ambulatory Visit: Payer: Self-pay

## 2017-07-27 DIAGNOSIS — Z202 Contact with and (suspected) exposure to infections with a predominantly sexual mode of transmission: Secondary | ICD-10-CM | POA: Insufficient documentation

## 2017-07-27 DIAGNOSIS — K120 Recurrent oral aphthae: Secondary | ICD-10-CM | POA: Insufficient documentation

## 2017-07-27 DIAGNOSIS — F17210 Nicotine dependence, cigarettes, uncomplicated: Secondary | ICD-10-CM | POA: Insufficient documentation

## 2017-07-27 DIAGNOSIS — Z113 Encounter for screening for infections with a predominantly sexual mode of transmission: Secondary | ICD-10-CM

## 2017-07-27 NOTE — ED Provider Notes (Signed)
[facility]   [id number] 07/27/17 Arrival Time: 1213  ASSESSMENT & PLAN:  1. Oral aphthous ulcer   2. Screening for STD (sexually transmitted disease)    Oragel for mouth ulcer as needed. Should be healing up within the next several days. Urine cytology sent. Will notify of any positive results. Instructed to refrain from sexual activity for at least seven days.  Reviewed expectations re: course of current medical issues. Questions answered. Outlined signs and symptoms indicating need for more acute intervention. Patient verbalized understanding. After Visit Summary given.   SUBJECTIVE:  Donnajean LopesMaha Chaddock is a 21 y.o. female who presents with complaint of oral discomfort, specifically her lower R gum/cheek. Started approx 2 days ago after getting a piece of candy stuck in that area. Brushed very hard after. Pain began shortly after. No worsening. Afebrile. Tolerating PO intake. No n/v. No self treatment. History of similar symptoms: No.  Also requests STD testing. No current symptoms. Occasional sexual activity with one female partner. Patient's last menstrual period was 05/23/2017 (approximate).  ROS: As per HPI.  OBJECTIVE:  Vitals:   07/27/17 1247  BP: 124/85  Pulse: 97  Temp: 98.9 F (37.2 C)  TempSrc: Oral  SpO2: 99%    General appearance: alert, cooperative, appears stated age and no distress Throat: lips, mucosa, and tongue normal; teeth and gums normal except for small ulceration just where gum meets cheek on R lower side; no sign of infection Back: no CVA tenderness GU: declines Skin: warm and dry Psychological:  Alert and cooperative. Normal mood and affect.   Labs Reviewed  RPR  HIV ANTIBODY (ROUTINE TESTING)  URINE CYTOLOGY ANCILLARY ONLY     Allergies  Allergen Reactions  . Cat Hair Extract Itching    sneezing  . Dust Mite Extract Itching    Past Medical History:  Diagnosis Date  . Anxiety   . Depression   . Heart palpitations   .  Vision abnormalities    Family History  Problem Relation Age of Onset  . Anxiety disorder Mother   . Depression Mother   . Anxiety disorder Brother   . Depression Brother   . Depression Father    Social History   Socioeconomic History  . Marital status: Single    Spouse name: Not on file  . Number of children: Not on file  . Years of education: Not on file  . Highest education level: Not on file  Social Needs  . Financial resource strain: Not on file  . Food insecurity - worry: Not on file  . Food insecurity - inability: Not on file  . Transportation needs - medical: Not on file  . Transportation needs - non-medical: Not on file  Occupational History  . Occupation: Student    Comment: 11th grade at [employer]  Tobacco Use  . Smoking status: Current Every Day Smoker    Packs/day: 0.20    Types: Cigarettes    Last attempt to quit: 08/14/2012    Years since quitting: 4.9  . Smokeless tobacco: Never Used  Substance and Sexual Activity  . Alcohol use: Yes    Comment: report 2 beers today  . Drug use: Yes    Types: Marijuana, Amphetamines, Benzodiazepines    Comment: last THC use Dec 2014, last amphet 09/13/12  . Sexual activity: No  Other Topics Concern  . Not on file  Social History Narrative  . Not on file          Mardella LaymanHagler, Jerolyn Flenniken, MD  07/28/17 1003  

## 2017-07-27 NOTE — ED Triage Notes (Signed)
Per pt she was eating candy at work and it cut her cheek, per pt she kept pulling on her cheek, per pt the next day she got sores in her cheeks

## 2017-07-28 LAB — URINE CYTOLOGY ANCILLARY ONLY
Chlamydia: NEGATIVE
NEISSERIA GONORRHEA: NEGATIVE
Trichomonas: NEGATIVE

## 2017-07-28 LAB — HIV ANTIBODY (ROUTINE TESTING W REFLEX): HIV Screen 4th Generation wRfx: NONREACTIVE

## 2017-07-28 LAB — RPR: RPR: NONREACTIVE

## 2017-08-01 LAB — URINE CYTOLOGY ANCILLARY ONLY
BACTERIAL VAGINITIS: POSITIVE — AB
CANDIDA VAGINITIS: NEGATIVE

## 2017-08-13 ENCOUNTER — Telehealth (HOSPITAL_COMMUNITY): Payer: Self-pay | Admitting: Emergency Medicine

## 2017-08-13 MED ORDER — METRONIDAZOLE 500 MG PO TABS: 500.0000 mg | ORAL_TABLET | Freq: Two times a day (BID) | ORAL | 0 refills | Status: AC

## 2017-08-13 NOTE — Telephone Encounter (Signed)
Pt called... [facility] Phone number updated.... Pt ID'd properly Reports persistent vag d/c Requested for Flagyl to be called in to [facility] ([address])  Adv pt if sx are not getting better to return or to f/u w/PCP Education on safe sex given Notified pt that lab results can be obtained through MyChart Pt verb understanding.

## 2018-01-03 ENCOUNTER — Encounter (HOSPITAL_COMMUNITY): Payer: Self-pay | Admitting: Emergency Medicine

## 2018-01-03 ENCOUNTER — Emergency Department (HOSPITAL_COMMUNITY)
Admission: EM | Admit: 2018-01-03 | Discharge: 2018-01-03 | Disposition: A | Discharge status: Home / Self Care | Payer: Self-pay | Attending: Emergency Medicine | Admitting: Emergency Medicine

## 2018-01-03 ENCOUNTER — Other Ambulatory Visit: Payer: Self-pay

## 2018-01-03 DIAGNOSIS — Z79899 Other long term (current) drug therapy: Secondary | ICD-10-CM | POA: Insufficient documentation

## 2018-01-03 DIAGNOSIS — F419 Anxiety disorder, unspecified: Secondary | ICD-10-CM | POA: Insufficient documentation

## 2018-01-03 DIAGNOSIS — F17210 Nicotine dependence, cigarettes, uncomplicated: Secondary | ICD-10-CM | POA: Insufficient documentation

## 2018-01-03 DIAGNOSIS — Z7982 Long term (current) use of aspirin: Secondary | ICD-10-CM | POA: Insufficient documentation

## 2018-01-03 MED ORDER — LORAZEPAM 1 MG PO TABS: 1.0000 mg | ORAL_TABLET | Freq: Four times a day (QID) | ORAL | 0 refills | Status: AC | PRN

## 2018-01-03 MED ORDER — SERTRALINE HCL 25 MG PO TABS: ORAL_TABLET | ORAL | 0 refills | Status: AC

## 2018-01-03 MED ORDER — LORAZEPAM 1 MG PO TABS
1.0000 mg | ORAL_TABLET | Freq: Once | ORAL | Status: AC
  Administered 2018-01-03: 1 mg via ORAL
  Filled 2018-01-03: qty 1

## 2018-01-03 NOTE — ED Triage Notes (Addendum)
Pt reports hx of anxiety, states she used to take meds for it but since her mom passed away about 9 months ago she has not taken any meds. Pt denies any SI/HI, tearful in triage. Pt also reports a sore on her right lower lip, states she has a tendency to pick at her mouth when she feels anxious.

## 2018-01-03 NOTE — ED Provider Notes (Signed)
[facility] EMERGENCY DEPARTMENT Provider Note   CSN: [id number] Arrival date & time: 01/03/18  0707     History   Chief Complaint Chief Complaint  Patient presents with  . Anxiety    HPI Erin Arroyo is a 22 y.o. female.  HPI  22 year old female with a history of anxiety presents with worsening anxiety.  She states this is been worsening for about 9 or 10 months since her mother passed away.  She is to be on medicine for anxiety but does not remember what it was.  However she stopped this when her mom passed away.  She is had worsening anxiety since.  She has done certain behaviors such as scratching her face or biting her lips.  The anxiety has progressively worsened and is almost a constant sensation.  She denies suicidal or homicidal thoughts.  She is to see [facility] but does not like going there but also does not have insurance.  She denies feeling ill otherwise including no fevers, pain. Anxiety is severe.  She has noticed a red spot to her right lower lip that has been there for about 10 months.  It does seem to wax and wane, it goes away if she scratches it but then comes back.  Past Medical History:  Diagnosis Date  . Anxiety   . Anxiety   . Depression   . Heart palpitations   . Vision abnormalities     Patient Active Problem List   Diagnosis Date Noted  . MDD (major depressive disorder), recurrent episode, severe (HCC) 12/22/2015  . Depressive disorder 09/14/2012  . GAD (generalized anxiety disorder) 09/14/2012  . Polysubstance abuse (HCC) 09/14/2012    Past Surgical History:  Procedure Laterality Date  . APPENDECTOMY  Age 40     OB History   None      Home Medications    Prior to Admission medications   Medication Sig Start Date End Date Taking? Authorizing Provider  aspirin EC 325 MG tablet Take 325 mg by mouth as needed for mild pain.    [provider]  Brexpiprazole (REXULTI) 2 MG TABS Take 2 mg by mouth every evening. For  antidepressant augmentation. 12/23/15   Armandina Stammer I, NP  etonogestrel (NEXPLANON) 68 MG IMPL implant 1 each (68 mg total) by Subdermal route once. Implanted 12/17/2015: Birth control method 12/23/15   Nwoko, Nicole Kindred I, NP  LORazepam (ATIVAN) 1 MG tablet Take 1 tablet (1 mg total) by mouth every 6 (six) hours as needed for anxiety. 01/03/18   Pricilla Loveless, MD  metroNIDAZOLE (FLAGYL) 500 MG tablet Take 1 tablet (500 mg total) by mouth 2 (two) times daily. 08/13/17   Isa Rankin, MD  ondansetron (ZOFRAN ODT) 4 MG disintegrating tablet Take 1 tablet (4 mg total) by mouth every 8 (eight) hours as needed for nausea or vomiting. 06/01/17   Alvira Monday, MD  phenazopyridine (PYRIDIUM) 200 MG tablet Take 1 tablet (200 mg total) by mouth 3 (three) times daily. 06/09/17   Multani, Bhupinder, NP  sertraline (ZOLOFT) 25 MG tablet Start with 25 mg per day x 1 week, then increase to 50 mg per day for one week 01/03/18   Pricilla Loveless, MD    Family History Family History  Problem Relation Age of Onset  . Anxiety disorder Mother   . Depression Mother   . Anxiety disorder Brother   . Depression Brother   . Depression Father     Social History Social History  Tobacco Use  . Smoking status: Current Every Day Smoker    Packs/day: 0.20    Types: Cigarettes    Last attempt to quit: 08/14/2012    Years since quitting: 5.3  . Smokeless tobacco: Never Used  Substance Use Topics  . Alcohol use: Yes    Comment: report 2 beers today  . Drug use: Yes    Types: Marijuana, Amphetamines, Benzodiazepines    Comment: last THC use Dec 2014, last amphet 09/13/12     Allergies   Cat hair extract and Dust mite extract   Review of Systems Review of Systems  Constitutional: Negative for fever.  Gastrointestinal: Negative for vomiting.  Psychiatric/Behavioral: Negative for self-injury and suicidal ideas. The patient is nervous/anxious.   All other systems reviewed and are negative.    Physical  Exam Updated Vital Signs BP (!) 140/97 (BP Location: Right Arm)   Pulse 93   Temp 98.1 F (36.7 C) (Oral)   Resp 18   SpO2 100%   Physical Exam  Constitutional: She appears well-developed and well-nourished.  HENT:  Head: Normocephalic and atraumatic.    Right Ear: External ear normal.  Left Ear: External ear normal.  Nose: Nose normal.  Eyes: Right eye exhibits no discharge. Left eye exhibits no discharge.  Cardiovascular: Normal rate, regular rhythm and normal heart sounds.  Pulmonary/Chest: Effort normal and breath sounds normal.  Abdominal: She exhibits no distension.  Neurological: She is alert.  Skin: Skin is warm and dry. She is not diaphoretic.  Psychiatric: Her mood appears anxious. She exhibits a depressed mood. She expresses no suicidal ideation.  Nursing note and vitals reviewed.    ED Treatments / Results  Labs (all labs ordered are listed, but only abnormal results are displayed) Labs Reviewed - No data to display  EKG None  Radiology No results found.  Procedures Procedures (including critical care time)  Medications Ordered in ED Medications  LORazepam (ATIVAN) tablet 1 mg (has no administration in time range)     Initial Impression / Assessment and Plan / ED Course  I have reviewed the triage vital signs and the nursing notes.  Pertinent labs & imaging results that were available during my care of the patient were reviewed by me and considered in my medical decision making (see chart for details).     Patient appears anxious.  She is not suicidal or homicidal.  She does not have any significant abnormalities on her vital signs besides some hypertension.  She also has no acute medical complaints.  Given her long-standing history of anxiety for over 6 years, I think it is reasonable to restart her on Zoloft.  I discussed she will start lower dose of this and then build up.  She will need to follow-up with either [facility] or another psychiatry  facility.  However she does not appear to need emergent psychiatric stabilization or admission.  She does appear quite anxious and I will give her an Ativan now in a very short course of Ativan for as needed severe anxiety.  We discussed return precautions.  As for the small lesions on her face, it is unclear exactly what they are.  Does not appear exactly like a cold sore and it is a very fine rash.  Given the chronicity, will follow-up with PCP and/or a dermatologist.  Final Clinical Impressions(s) / ED Diagnoses   Final diagnoses:  Anxiety    ED Discharge Orders        Ordered    sertraline (  ZOLOFT) 25 MG tablet     01/03/18 0943    LORazepam (ATIVAN) 1 MG tablet  Every 6 hours PRN     01/03/18 1610       Pricilla Loveless, MD 01/03/18 [phone]

## 2018-01-03 NOTE — ED Notes (Signed)
Pt states she has been sad since her mother dies . States she used to be on meds but stopped taking them, pt very twearfu, her dad is with her

## 2018-01-03 NOTE — Discharge Instructions (Signed)
If your anxiety worsens or he develop thoughts of wanting to hurt or kill yourself or hurt anyone else, return to the ER immediately or call 911.  Otherwise follow-up with either [facility] or another psychologist/psychiatrist for help with your anxiety.   Substance Abuse Treatment Programs  Intensive Outpatient Programs [facility]     [address]      [place], [place]                   [phone]       [facility] [address] [place] [address] [place], [place] [phone]  [facility]     (Inpatient and outpatient)     [address]           [phone]    [facility] [phone] (Suboxone and Methadone)  [address]      [place], [place] [address]      [phone]       [address] [place], [place] [phone]  [facility] (Outpatient/Inpatient, [facility])    (insurance only) [phone]             [facility] (Groups & Residential) [place], [place] [phone]     [facility]     [address]     [place], [place]      [phone]       [facility] (for caregivers and family) [phone] [address] [place], [place] [phone]      Residential Treatment Programs [facility]      [address] [place], [place] [address]  [phone]       [facility] [address], [place], [place] [address] [phone]  [facility] [place]        [phone]       [facility] [phone]  [facility] ([facility]             [address]                                         [place], [place]                                                [phone] or [phone]                               [facility] [address] [place], [address] [phone]  [facility]    [address]      [place], [place]     [phone]       [facility] [address] [place], [place] [phone]  [facility]   [address] [place], [place] [address]     [phone]      Admissions: 8am-3pm M-F  [facility] ([facility]) [address] [place], [place] [phone]  [facility]: Residential Program [phone] Days)   [place], [place]      [phone] or [phone]     [facility]: [facility] [place], [place] (Walk in Hours over the weekend or by referral)  [facility] [address] [place], [place], [place] [address] [phone]  Crisis Mobile: [facility]:  [phone] (for crisis response 24 hours a day) [facility] Hotline:      [phone] Outpatient Psychiatry and Counseling  [facility]: Mobile Crisis Management 24 hours:  [phone]  [facility] of the [employer] sliding scale fee and walk in schedule: M-F 8am-12pm/1pm-3pm [address]  [place], [place] [address] [phone]  [facility] [address] [place], [place] [address] [phone]  [facility] (Formerly [facility] as [facility] [employer])- new patient walk-in appointments available Monday - Friday 8am -3pm.          [address] [place], [place] [address] [phone] or crisis line- [phone]  [facility]/ Intensive Outpatient Therapy Program [address] [place], [place] [address] [phone]  [facility]                  Crisis Services      [phone] [address]     [place], [place] [address]                 [facility]   [facility] [phone]. [address] [place], [place] [address]   [employer] of Care          [address] Bea Laura  [place], [place] [address]       [phone]  [facility] [address] [place], [place] [address] [phone]  [facility]    [address]    [place], [place] [address]     [phone]       Andee Poles, MD     [address]     [place] [address]     [phone]       [facility] [address] [place] [address]  [facility]     [address] [place], [place]      [phone]       [facility] Eulogio Ditch, MD [address] [place], [place] [address] [phone]  [facility]     [address]     [place], [place] [address]     [phone]       [facility] [address] [place], [place] [address] [phone] Resources for Temporary Residential Assistance/Crisis Centers  [facility]) M-F 8am-3pm   [address] [place], [place] [address]   [phone] Services include: laundry, barbering, support groups, case management, phone  & computer access, showers, AA/NA mtgs, mental health/substance abuse nurse, job skills class, disability information, VA assistance, spiritual classes, etc.   HOMELESS SHELTERS  [facility]     [facility]   [address], [place]     [phone]              [employer] (women and children)       [address] [place], [place] [address] [phone] [email or web address] for application and process Application Required  [facility]   [address]    [place] [address]     [phone]                    [facility] [address] [place], [place] [address] [phone] application appt.) Application Required  [facility] (women only)    [address]     [place], [place] [address]     [phone]      Intake starts 6pm daily Need valid ID, SSC, & Police report [employer] [address] [place], [place] [phone] Application  Required  [employer] (men only)     [address]      [place], [place]     [phone]       [facility] of the [place] (Pregnant women only) [address] [place], [place] [phone]  [facility]      [address]      [place], [place] [address]     [phone]             [facility] [address] [place], [place] [phone] 90 day commitment/SA/Application process  [facility](men only)     [address]     [place], [place]     [phone]       Check-in at [facility] [address] [place], [place] [address] [phone] Men/Women/Women and Children must be there by 7 pm  [facility] [place], [place] [phone]

## 2020-01-10 ENCOUNTER — Ambulatory Visit: Payer: Self-pay

## 2020-01-14 ENCOUNTER — Ambulatory Visit: Payer: Self-pay | Attending: Internal Medicine

## 2020-01-14 DIAGNOSIS — Z23 Encounter for immunization: Secondary | ICD-10-CM

## 2020-01-14 NOTE — Progress Notes (Signed)
   Covid-19 Vaccination Clinic  Name:  Erin Arroyo    MRN: [id number] DOB: July 06, 1996  01/14/2020  [name] was observed post Covid-19 immunization for 15 minutes without incident. She was provided with Vaccine Information Sheet and instruction to access the V-Safe system.   [name] was instructed to call 911 with any severe reactions post vaccine: Marland Kitchen Difficulty breathing  . Swelling of face and throat  . A fast heartbeat  . A bad rash all over body  . Dizziness and weakness   Immunizations Administered    Name Date Dose VIS Date Route   Pfizer COVID-19 Vaccine 01/14/2020 11:25 AM 0.3 mL 10/17/2018 Intramuscular   Manufacturer: [employer], [employer]   Lot: [id number]   NDC: [address]-5

## 2020-02-04 ENCOUNTER — Ambulatory Visit: Payer: Self-pay | Attending: Internal Medicine

## 2020-02-04 DIAGNOSIS — Z23 Encounter for immunization: Secondary | ICD-10-CM

## 2020-02-04 NOTE — Progress Notes (Signed)
   Covid-19 Vaccination Clinic  Name:  Myliyah Rebuck    MRN: [id number] DOB: [birth year]  02/04/2020  Ms. Fahrney was observed post Covid-19 immunization for 15 minutes without incident. She was provided with Vaccine Information Sheet and instruction to access the V-Safe system.   Ms. Weisensel was instructed to call 911 with any severe reactions post vaccine: Marland Kitchen Difficulty breathing  . Swelling of face and throat  . A fast heartbeat  . A bad rash all over body  . Dizziness and weakness   Immunizations Administered    Name Date Dose VIS Date Route   Pfizer COVID-19 Vaccine 02/04/2020 11:24 AM 0.3 mL 10/17/2018 Intramuscular   Manufacturer: [employer], [employer]   Lot: [id number]   NDC: [address]-3

## 2024-03-06 ENCOUNTER — Emergency Department (HOSPITAL_COMMUNITY)
Admission: EM | Admit: 2024-03-06 | Discharge: 2024-03-06 | Disposition: A | Discharge status: Home / Self Care | Payer: Self-pay | Attending: Emergency Medicine | Admitting: Emergency Medicine

## 2024-03-06 ENCOUNTER — Other Ambulatory Visit: Payer: Self-pay

## 2024-03-06 ENCOUNTER — Encounter (HOSPITAL_COMMUNITY): Payer: Self-pay | Admitting: Pharmacy Technician

## 2024-03-06 DIAGNOSIS — R112 Nausea with vomiting, unspecified: Secondary | ICD-10-CM | POA: Insufficient documentation

## 2024-03-06 DIAGNOSIS — E876 Hypokalemia: Secondary | ICD-10-CM | POA: Insufficient documentation

## 2024-03-06 DIAGNOSIS — R197 Diarrhea, unspecified: Secondary | ICD-10-CM | POA: Insufficient documentation

## 2024-03-06 DIAGNOSIS — R1084 Generalized abdominal pain: Secondary | ICD-10-CM | POA: Insufficient documentation

## 2024-03-06 LAB — URINALYSIS, ROUTINE W REFLEX MICROSCOPIC
Bilirubin Urine: NEGATIVE
Glucose, UA: NEGATIVE mg/dL
Hgb urine dipstick: NEGATIVE
Ketones, ur: 20 mg/dL — AB
Leukocytes,Ua: NEGATIVE
Nitrite: NEGATIVE
Protein, ur: NEGATIVE mg/dL
Specific Gravity, Urine: 1.019 (ref 1.005–1.030)
pH: 6 (ref 5.0–8.0)

## 2024-03-06 LAB — RAPID URINE DRUG SCREEN, HOSP PERFORMED
Amphetamines: NOT DETECTED
Barbiturates: NOT DETECTED
Benzodiazepines: NOT DETECTED
Cocaine: NOT DETECTED
Opiates: NOT DETECTED
Tetrahydrocannabinol: POSITIVE — AB

## 2024-03-06 LAB — CBC
HCT: 46.4 % — ABNORMAL HIGH (ref 36.0–46.0)
Hemoglobin: 16.6 g/dL — ABNORMAL HIGH (ref 12.0–15.0)
MCH: 32 pg (ref 26.0–34.0)
MCHC: 35.8 g/dL (ref 30.0–36.0)
MCV: 89.4 fL (ref 80.0–100.0)
Platelets: 336 K/uL (ref 150–400)
RBC: 5.19 MIL/uL — ABNORMAL HIGH (ref 3.87–5.11)
RDW: 11.5 % (ref 11.5–15.5)
WBC: 9.3 K/uL (ref 4.0–10.5)
nRBC: 0 % (ref 0.0–0.2)

## 2024-03-06 LAB — COMPREHENSIVE METABOLIC PANEL WITH GFR
ALT: 19 U/L (ref 0–44)
AST: 22 U/L (ref 15–41)
Albumin: 4.2 g/dL (ref 3.5–5.0)
Alkaline Phosphatase: 62 U/L (ref 38–126)
Anion gap: 16 — ABNORMAL HIGH (ref 5–15)
BUN: 12 mg/dL (ref 6–20)
CO2: 16 mmol/L — ABNORMAL LOW (ref 22–32)
Calcium: 9.5 mg/dL (ref 8.9–10.3)
Chloride: 104 mmol/L (ref 98–111)
Creatinine, Ser: 0.79 mg/dL (ref 0.44–1.00)
GFR, Estimated: >60 mL/min (ref >60)
Glucose, Bld: 110 mg/dL — ABNORMAL HIGH (ref 70–99)
Potassium: 2.9 mmol/L — ABNORMAL LOW (ref 3.5–5.1)
Sodium: 136 mmol/L (ref 135–145)
Total Bilirubin: 1.7 mg/dL — ABNORMAL HIGH (ref 0.0–1.2)
Total Protein: 8.1 g/dL (ref 6.5–8.1)

## 2024-03-06 LAB — MAGNESIUM: Magnesium: 2.2 mg/dL (ref 1.7–2.4)

## 2024-03-06 LAB — LIPASE, BLOOD: Lipase: 30 U/L (ref 11–51)

## 2024-03-06 LAB — HCG, SERUM, QUALITATIVE: Preg, Serum: NEGATIVE

## 2024-03-06 MED ORDER — ONDANSETRON HCL 4 MG PO TABS: 4.0000 mg | ORAL_TABLET | Freq: Four times a day (QID) | ORAL | 0 refills | Status: AC

## 2024-03-06 MED ORDER — ONDANSETRON 4 MG PO TBDP: 4.0000 mg | ORAL_TABLET | Freq: Once | ORAL | Status: DC | PRN

## 2024-03-06 MED ORDER — FAMOTIDINE 20 MG PO TABS: 20.0000 mg | ORAL_TABLET | Freq: Two times a day (BID) | ORAL | 0 refills | Status: AC

## 2024-03-06 MED ORDER — ONDANSETRON HCL 4 MG/2ML IJ SOLN
4.0000 mg | Freq: Once | INTRAMUSCULAR | Status: AC
  Administered 2024-03-06: 4 mg via INTRAVENOUS
  Filled 2024-03-06: qty 2

## 2024-03-06 MED ORDER — POTASSIUM CHLORIDE CRYS ER 20 MEQ PO TBCR
40.0000 meq | EXTENDED_RELEASE_TABLET | Freq: Once | ORAL | Status: AC
  Administered 2024-03-06: 40 meq via ORAL
  Filled 2024-03-06: qty 2

## 2024-03-06 MED ORDER — ONDANSETRON HCL 4 MG PO TABS: 4.0000 mg | ORAL_TABLET | Freq: Four times a day (QID) | ORAL | 0 refills | Status: DC

## 2024-03-06 MED ORDER — LACTATED RINGERS IV BOLUS
1000.0000 mL | Freq: Once | INTRAVENOUS | Status: AC
  Administered 2024-03-06: 1000 mL via INTRAVENOUS

## 2024-03-06 NOTE — ED Provider Notes (Signed)
 [place] EMERGENCY DEPARTMENT AT [facility] Provider Note   CSN: [id number] Arrival date & time: 03/06/24  0715     Patient presents with: Emesis   Erin Arroyo is a 28 y.o. female.    Emesis Associated symptoms: abdominal pain, chills and diarrhea    Patient is a 28 year old female seen in the ED today with concerns for nausea, vomiting, diarrhea, chills present for the last 5 days noticeably happening directly after taking semaglutide 1 mg after being off it for 3 weeks, also noting significant marijuana use and alcohol use that same day.    medical history of GAD, polysubstance abuse, MDD  Note that she has been on semaglutide for a year and it had no issues but had recently stopped taking it for 3 weeks.  Since symptom onset, she has noted that she has had watery diarrhea, persistent vomiting, nonbloody.  Reporting vomiting episodes every 30 minutes.  Took Zofran  this morning with some relief.  Noting that she is also reporting some generalized abdominal pain.  Reports that she is not having diarrhea as of today because she does not believe that she is getting the fluids but still feels like she needs to.  Has not tried hot showers/baths.  Denies fever, headache, vision changes, chest pain, shortness of breath, hematemesis, hemoptysis, cough, hematochezia, melena, dysuria, hematuria, vaginal discharge, vaginal bleeding, vaginal pain, vaginal itching, lower leg swelling.    Prior to Admission medications   Medication Sig Start Date End Date Taking? Authorizing Provider  ondansetron  (ZOFRAN ) 4 MG tablet Take 1 tablet (4 mg total) by mouth every 6 (six) hours. 03/06/24  Yes Beola Terrall RAMAN, PA-C  aspirin EC 325 MG tablet Take 325 mg by mouth as needed for mild pain.    [provider]  Brexpiprazole  (REXULTI ) 2 MG TABS Take 2 mg by mouth every evening. For antidepressant augmentation. 12/23/15   Collene Gouge I, NP  etonogestrel  (NEXPLANON ) 68 MG IMPL implant 1  each (68 mg total) by Subdermal route once. Implanted 12/17/2015: Birth control method 12/23/15   Collene Gouge I, NP  LORazepam  (ATIVAN ) 1 MG tablet Take 1 tablet (1 mg total) by mouth every 6 (six) hours as needed for anxiety. 01/03/18   Freddi Hamilton, MD  metroNIDAZOLE  (FLAGYL ) 500 MG tablet Take 1 tablet (500 mg total) by mouth 2 (two) times daily. 08/13/17   Jason Leita Blush, MD  ondansetron  (ZOFRAN  ODT) 4 MG disintegrating tablet Take 1 tablet (4 mg total) by mouth every 8 (eight) hours as needed for nausea or vomiting. 06/01/17   Dreama Longs, MD  phenazopyridine  (PYRIDIUM ) 200 MG tablet Take 1 tablet (200 mg total) by mouth 3 (three) times daily. 06/09/17   Multani, Bhupinder, MD  sertraline  (ZOLOFT ) 25 MG tablet Start with 25 mg per day x 1 week, then increase to 50 mg per day for one week 01/03/18   Freddi Hamilton, MD    Allergies: Cat dander and Dust mite extract    Review of Systems  Constitutional:  Positive for chills.  Gastrointestinal:  Positive for abdominal pain, diarrhea, nausea and vomiting.  All other systems reviewed and are negative.   Updated Vital Signs BP (!) 138/91 (BP Location: Right Arm)   Pulse 67   Temp 99.4 F (37.4 C) (Oral)   Resp 16   SpO2 100%   Physical Exam Vitals and nursing note reviewed.  Constitutional:      General: She is not in acute distress.    Appearance: Normal  appearance. She is not ill-appearing or diaphoretic.  HENT:     Head: Normocephalic and atraumatic.     Nose: Nose normal.     Mouth/Throat:     Mouth: Mucous membranes are moist.     Pharynx: Oropharynx is clear. No oropharyngeal exudate or posterior oropharyngeal erythema.  Eyes:     General: No scleral icterus.       Right eye: No discharge.        Left eye: No discharge.     Extraocular Movements: Extraocular movements intact.     Conjunctiva/sclera: Conjunctivae normal.     Pupils: Pupils are equal, round, and reactive to light.  Cardiovascular:     Rate and  Rhythm: Normal rate and regular rhythm.     Pulses: Normal pulses.     Heart sounds: Normal heart sounds. No murmur heard.    No friction rub. No gallop.  Pulmonary:     Effort: Pulmonary effort is normal. No respiratory distress.     Breath sounds: Normal breath sounds. No stridor. No wheezing, rhonchi or rales.  Chest:     Chest wall: No tenderness.  Abdominal:     General: Abdomen is flat. There is no distension.     Palpations: Abdomen is soft.     Tenderness: There is abdominal tenderness (Generalized abdominal pain to palpation that localizes to lower abdomen generally). There is no right CVA tenderness, left CVA tenderness, guarding or rebound.  Musculoskeletal:     Cervical back: Normal range of motion and neck supple. No rigidity or tenderness.     Right lower leg: No edema.     Left lower leg: No edema.  Skin:    General: Skin is warm and dry.     Coloration: Skin is not pale.     Findings: No bruising, erythema or lesion.  Neurological:     General: No focal deficit present.     Mental Status: She is alert and oriented to person, place, and time. Mental status is at baseline.     Sensory: No sensory deficit.     Motor: No weakness.     Gait: Gait normal.  Psychiatric:        Mood and Affect: Mood normal.     (all labs ordered are listed, but only abnormal results are displayed) Labs Reviewed  COMPREHENSIVE METABOLIC PANEL WITH GFR - Abnormal; Notable for the following components:      Result Value   Potassium 2.9 (*)    CO2 16 (*)    Glucose, Bld 110 (*)    Total Bilirubin 1.7 (*)    Anion gap 16 (*)    All other components within normal limits  CBC - Abnormal; Notable for the following components:   RBC 5.19 (*)    Hemoglobin 16.6 (*)    HCT 46.4 (*)    All other components within normal limits  URINALYSIS, ROUTINE W REFLEX MICROSCOPIC - Abnormal; Notable for the following components:   APPearance HAZY (*)    Ketones, ur 20 (*)    All other components  within normal limits  RAPID URINE DRUG SCREEN, HOSP PERFORMED - Abnormal; Notable for the following components:   Tetrahydrocannabinol POSITIVE (*)    All other components within normal limits  LIPASE, BLOOD  HCG, SERUM, QUALITATIVE  MAGNESIUM     EKG: EKG Interpretation Date/Time:  Tuesday March 06 2024 10:26:58 EDT Ventricular Rate:  75 PR Interval:  138 QRS Duration:  72 QT Interval:  388 QTC  Calculation: 433 R Axis:   30  Text Interpretation: Normal sinus rhythm with sinus arrhythmia Cannot rule out Anterior infarct , age undetermined Abnormal ECG When compared with ECG of 07-Apr-2014 02:07, PREVIOUS ECG IS PRESENT Since last tracing rate slower Confirmed by Dean Clarity [phone]) on 03/06/2024 11:05:42 AM  Radiology: No results found.  Procedures   Medications Ordered in the ED  potassium chloride  SA (KLOR-CON  M) CR tablet 40 mEq (40 mEq Oral Given 03/06/24 1200)  lactated ringers  bolus 1,000 mL (1,000 mLs Intravenous New Bag/Given 03/06/24 1158)  ondansetron  (ZOFRAN ) injection 4 mg (4 mg Intravenous Given 03/06/24 1200)                                  Medical Decision Making Amount and/or Complexity of Data Reviewed Labs: ordered. ECG/medicine tests: ordered.  Risk Prescription drug management.   This patient is a 28 year old female who presents to the ED for concern of nausea, vomiting, diarrhea, generalized abdominal pain that localizes to lower abdomen present for the last 5 days, noting to have started directly after starting 1 mg of semaglutide after having been off it for 3 weeks, noting concomitant significant alcohol use and heavy marijuana use.  Reports vomiting every 30 minutes and had to had diarrhea until she has not been able to go anymore despite feeling the need to have a bowel movement.  On physical exam, patient is in no acute distress, afebrile, alert and orient x 4, speaking in full sentences, nontachypneic, nontachycardic.  Notably has  generalized abdominal tenderness to palpation that is more tender to the lower abdomen.  No guarding, no rebound tenderness, negative Murphy sign.  Exam was otherwise unremarkable.  The patient's presentations, will obtain baseline labs for abdominal pain, believe this most likely to be either marijuana induced or semaglutide induced illness or alcoholic gastritis.  Low suspicion for appendicitis, SBO, ovarian torsion at this time with no leukocytosis, no peritoneal signs, no focal localization of pain, as well as the presence of persistent diarrhea.  Do not feel CT imaging is warranted at this time however did consider I do believe likely diagnosis is likely drug reaction to semaglutide or cannabinoid hyperemesis syndrome.  On reevaluation, she is noting to feel significantly better.  She has tolerated p.o. food and fluids at this time and states her nausea has been resolved with Zofran .  Will send her home with Zofran  and continue to have her monitor symptoms at home.  Recommended that she continue to follow-up with PCP for lab redraw to ensure resolution of the hypokalemia today and to begin brat diet to ensure less chance of upsetting stomach further.  Recommend that she cease her THC, alcohol and semaglutide at this time as she was mainly on this medication for weight loss.  Recommend she follow-up with her primary care for further evaluation of this.  Patient vital signs have remained stable throughout the course of patient's time in the ED. Low suspicion for any other emergent pathology at this time. I believe this patient is safe to be discharged. Provided strict return to ER precautions. Patient expressed agreement and understanding of plan. All questions were answered.  Differential diagnoses prior to evaluation: The emergent differential diagnosis includes, but is not limited to, drug reaction, alcohol withdrawal, electrolyte disturbance, appendicitis, Diverticulitis, Gastritis, Enteritis,  Incarcerated or Strangulated Hernia, Nephrolithiasis , Pyelonephritis, Inflammatory Bowel Disease, Mesenteric Adenitis, Gastroenteritis, Constipation, Endometriosis, Ectopic Pregnancy, Ovarian Torsion, Ruptured  Ovarian Cyst, Pelvic Inflammatory Disease (PID), Tubo-Ovarian Abscess, Mittelschmerz. This is not an exhaustive differential.   Past Medical History / Co-morbidities / Social History: GAD, polysubstance abuse, MDD  Additional history: Chart reviewed. Pertinent results include:   Last seen by psychiatry on 02/02/24, continuing with Zoloft , stopping hydroxyzine , starting light box therapy.  Lab Tests/Imaging studies: I personally interpreted labs/imaging and the pertinent results include:   CBC shows an elevated hemoglobin of 16.6, likely secondary to dehydration. CMP shows a moderate hypokalemia of 2.9, decreased CO2 of 16, increased bilirubin 1.7, increased anion gap of 16.  Likely starvation ketoacidosis secondary to nausea/vomiting and dehydration. Lipase unremarkable  UA positive for ketones, likely secondary to dehydration. UDS positive for THC Magnesium  unremarkable  Cardiac monitoring: EKG obtained and interpreted by myself and attending physician which shows: Normal sinus rhythm with sinus arrhythmia   Medications: I ordered medication including LR, Zofran , potassium.  I have reviewed the patients home medicines and have made adjustments as needed.  Critical Interventions: None  Social Determinants of Health: Known history of polysubstance abuse, frequently using marijuana.  Disposition: After consideration of the diagnostic results and the patients response to treatment, I feel that the patient would benefit from discharge and treatment as above.   emergency department workup does not suggest an emergent condition requiring admission or immediate intervention beyond what has been performed at this time. The plan is: Follow-up with PCP to establish care and/or revisit  urgent care for lab redraw to ensure resolution of hypokalemia, return to the ED for new or worsening symptoms, continue to hydrate and Zofran  for nausea. The patient is safe for discharge and has been instructed to return immediately for worsening symptoms, change in symptoms or any other concerns.    Final diagnoses:  Nausea vomiting and diarrhea  Hypokalemia    ED Discharge Orders          Ordered    ondansetron  (ZOFRAN ) 4 MG tablet  Every 6 hours        03/06/24 1250               Beola Terrall RAMAN, PA-C 03/06/24 1254    Dean Clarity, MD 03/06/24 1337

## 2024-03-06 NOTE — ED Notes (Signed)
 While taking BP on left arm patient felt tinging and her left hand stiffened.

## 2024-03-06 NOTE — ED Notes (Signed)
 Patient given ginger ale.

## 2024-03-06 NOTE — Discharge Instructions (Addendum)
 You were seen today for nausea, vomiting, diarrhea, hypokalemia.  You provided potassium and Zofran  have been tolerating eating and drinking at this time.  Your labs were reassuring, do not believe imaging is needed at this time.  However continue to monitor your symptoms and if any new or worsening symptoms, please return to the ED however believe this is most likely secondary to your medication of semaglutide, recommend you stop this and follow-up with PCP who prescribed this.  Also recommend cessation of marijuana use as well as alcohol as these may be causes or may be making the illness worse.  I am prescribing Zofran  for you to use for nausea.  You can pick this up at your pharmacy.  Also recommend using Tylenol  for additional pain relief.  Please follow-up with primary care and/or urgent care for lab redraw to ensure resolution of your low potassium today.

## 2024-03-06 NOTE — ED Triage Notes (Signed)
 Pt here with emesis and diarrhea for the last several days. Endorses throwing up every 30 minutes and unable to tolerate anything PO. Pt also reports chills. Is on semaglutide and thinks symptoms may be from taking this.

## 2024-09-11 ENCOUNTER — Telehealth (HOSPITAL_COMMUNITY): Payer: Self-pay

## 2024-09-11 NOTE — Telephone Encounter (Signed)
 Therapist called to reschedule session to thursday january 22nd at 10am. Client agreed. Therapist has a training to attaned on january 29th.

## 2024-09-13 ENCOUNTER — Ambulatory Visit (HOSPITAL_COMMUNITY): Payer: Self-pay

## 2024-09-13 DIAGNOSIS — F411 Generalized anxiety disorder: Secondary | ICD-10-CM

## 2024-09-13 NOTE — Progress Notes (Signed)
 Comprehensive Clinical Assessment (CCA) Note  Virtual Visit via Video Note  I connected with Erin Arroyo on 09/13/24 at 10:00 AM EST by a video enabled telemedicine application and verified that I am speaking with the correct person using two identifiers. Clinician's camera would not work. However, client still agreed to meet virtually.  Location: Patient: Home Address Provider: Clinician Home Office   I discussed the limitations of evaluation and management by telemedicine and the availability of in person appointments. The patient expressed understanding and agreed to proceed.   I discussed the assessment and treatment plan with the patient. The patient was provided an opportunity to ask questions and all were answered. The patient agreed with the plan and demonstrated an understanding of the instructions.   The patient was advised to call back or seek an in-person evaluation if the symptoms worsen or if the condition fails to improve as anticipated.  I provided 30 minutes of non-face-to-face time during this encounter.   [name], [facility]   09/13/2024 Erin Arroyo [id number]  Chief Complaint:  Chief Complaint  Patient presents with   Anxiety   Visit Diagnosis: generalized Anxiety Disorder    CCA Screening, Triage and Referral (STR)    What Is the Reason for Your Visit/Call Today? So I been on the same medication since 29years old. I'm always struggled with anxiety. As a teen I dealt with depression but my main problem is anxiety.  How Long Has This Been Causing You Problems? > than 6 months  What Do You Feel Would Help You the Most Today? No data recorded  Have You Recently Been in Any Inpatient Treatment (Hospital/Detox/Crisis Center/28-Day Program)? Yes  Name/Location of Program/Hospital:(16 years- ) (29 years old- 29years old [facility] In patient psychiatry)  How Long Were You There? a week each  When Were You Discharged? No data recorded  Have You  Ever Received Services From [facility] Before? Yes  Who Do You See at [facility]? In patient treatment   Have You Recently Had Any Thoughts About Hurting Yourself? No  Are You Planning to Commit Suicide/Harm Yourself At This time? No   Have you Recently Had Thoughts About Hurting Someone Sherral? No  Explanation: No data recorded  Have You Used Any Alcohol or Drugs in the Past 24 Hours? No  How Long Ago Did You Use Drugs or Alcohol? No data recorded What Did You Use and How Much? No data recorded  Do You Currently Have a Therapist/Psychiatrist? No  Name of Therapist/Psychiatrist: No data recorded  Have You Been Recently Discharged From Any Office Practice or Programs? No  Explanation of Discharge From Practice/Program: No data recorded    CCA Screening Triage Referral Assessment Type of Contact: Tele-Assessment  Is this Initial or Reassessment? Initial Assessment    Collateral Involvement: medication Management   Does Patient Have a Court Appointed Legal Guardian? No data recorded Name and Contact of Legal Guardian: No data recorded If Minor and Not Living with Parent(s), Who has Custody? No data recorded Is CPS involved or ever been involved? Never  Is APS involved or ever been involved? Never   Patient Determined To Be At Risk for Harm To Self or Others Based on Review of Patient Reported Information or Presenting Complaint? No  Method: No Plan  Availability of Means: No access or NA  Intent: Vague intent or NA  Notification Required: No need or identified person  Additional Information for Danger to Others Potential: No data recorded Additional Comments  for Danger to Others Potential: No data recorded Are There Guns or Other Weapons in Your Home? No  Types of Guns/Weapons: No data recorded Are These Weapons Safely Secured?                            No data recorded Who Could Verify You Are Able To Have These Secured: No data recorded Do You Have any  Outstanding Charges, Pending Court Dates, Parole/Probation? No data recorded Contacted To Inform of Risk of Harm To Self or Others: No data recorded  Location of Assessment: [facility]   Does Patient Present under Involuntary Commitment? No  IVC Papers Initial File Date: No data recorded  [place] of Residence: [place]   Patient Currently Receiving the Following Services: Medication Management   Determination of Need: No data recorded  Options For Referral: Medication Management     CCA Biopsychosocial Intake/Chief Complaint:  So I been on the same medication since 29years old. I'm always struggled with anxiety. As a teen I dealt with depression but my main problem is anxiety.  Current Symptoms/Problems: No data recorded  Patient Reported Schizophrenia/Schizoaffective Diagnosis in Past: No   Strengths: being a mother, smart, homemaker, good friend  Preferences: medication management and virtual appointments work better  Abilities: No data recorded  Type of Services Patient Feels are Needed: No data recorded  Initial Clinical Notes/Concerns: So I been on the same medication since 29years old. I'm always struggled with anxiety. As a teen I dealt with depression but my main problem is anxiety.   Mental Health Symptoms Depression:  Difficulty Concentrating; Irritability; Tearfulness; Weight gain/loss   Duration of Depressive symptoms: No data recorded  Mania:  Irritability   Anxiety:   Difficulty concentrating; Worrying   Psychosis:  None   Duration of Psychotic symptoms: No data recorded  Trauma:  None   Obsessions:  None   Compulsions:  None   Inattention:  None   Hyperactivity/Impulsivity:  None; Fidgets with hands/feet   Oppositional/Defiant Behaviors:  None   Emotional Irregularity:  None   Other Mood/Personality Symptoms:  No data recorded   Mental Status Exam Appearance and self-care  Stature:  Average   Weight:  Overweight    Clothing:  Casual   Grooming:  Normal   Cosmetic use:  Age appropriate   Posture/gait:  Normal   Motor activity:  Not Remarkable   Sensorium  Attention:  Normal   Concentration:  Normal   Orientation:  X5   Recall/memory:  Normal   Affect and Mood  Affect:  Appropriate   Mood:  Euthymic   Relating  Eye contact:  Normal   Facial expression:  Responsive   Attitude toward examiner:  Cooperative   Thought and Language  Speech flow: Clear and Coherent   Thought content:  Appropriate to Mood and Circumstances   Preoccupation:  None   Hallucinations:  None   Organization:  No data recorded  [employer] of Knowledge:  Fair   Intelligence:  Average   Abstraction:  Concrete   Judgement:  Good   Reality Testing:  Adequate   Insight:  Good   Decision Making:  Normal   Social Functioning  Social Maturity:  Responsible   Social Judgement:  Normal   Stress  Stressors:  Office Manager Ability:  Normal   Skill Deficits:  No data recorded  Supports:  Usual     Religion: Religion/Spirituality Are  You A Religious Person?: No (muslim)  Leisure/Recreation: Leisure / Recreation Do You Have Hobbies?: Yes Leisure and Hobbies: reading, automatic data  Exercise/Diet: Exercise/Diet Do You Exercise?: No Have You Gained or Lost A Significant Amount of Weight in the Past Six Months?: Yes-Gained Number of Pounds Gained: 15 Do You Follow a Special Diet?: No Do You Have Any Trouble Sleeping?: No   CCA Employment/Education Employment/Work Situation: Employment / Work Situation Employment Situation: Unemployed What is the Longest Time Patient has Held a Job?: 1 year and 1/2 Where was the Patient Employed at that Time?: [employer] Has Patient ever Been in the [employer]?: No  Education: Education Is Patient Currently Attending School?: No Last Grade Completed: 12 Did Garment/textile Technologist From [employer]?: Yes Did Theme Park Manager?: No Did  Designer, Television/film Set?: No Did You Have An Individualized Education Program (IIEP): No Did You Have Any Difficulty At [employer]?: No Patient's Education Has Been Impacted by Current Illness: No   CCA Family/Childhood History Family and Relationship History: Family history Marital status: Married Number of Years Married: 6 What types of issues is patient dealing with in the relationship?: no Are you sexually active?: Yes What is your sexual orientation?: female Does patient have children?: Yes How many children?: 1 How is patient's relationship with their children?: 78 year old son great relationship  Childhood History:  Childhood History By whom was/is the patient raised?: Both parents Additional childhood history information: Good Relationships Description of patient's relationship with caregiver when they were a child: Good Relationships Patient's description of current relationship with people who raised him/her: [place]- Father lives overseas, Mother passed away in 05-Oct-2016 How were you disciplined when you got in trouble as a child/adolescent?: verbal threats Does patient have siblings?: Yes Number of Siblings: 1 Description of patient's current relationship with siblings: Older brother good relationship Did patient suffer any verbal/emotional/physical/sexual abuse as a child?: No Did patient suffer from severe childhood neglect?: No Has patient ever been sexually abused/assaulted/raped as an adolescent or adult?: No Was the patient ever a victim of a crime or a disaster?: No Witnessed domestic violence?: No Has patient been affected by domestic violence as an adult?: No  Child/Adolescent Assessment:     CCA Substance Use Alcohol/Drug Use: Alcohol / Drug Use History of alcohol / drug use?: No history of alcohol / drug abuse                       ASAM's:  Six Dimensions of Multidimensional Assessment  Dimension 1:  Acute Intoxication and/or Withdrawal Potential:       Dimension 2:  Biomedical Conditions and Complications:      Dimension 3:  Emotional, Behavioral, or Cognitive Conditions and Complications:     Dimension 4:  Readiness to Change:     Dimension 5:  Relapse, Continued use, or Continued Problem Potential:     Dimension 6:  Recovery/Living Environment:     ASAM Severity Score:    ASAM Recommended Level of Treatment:     Substance use Disorder (SUD)    Summary  Therapist greeted client warmly and spent a few minutes introducing self, and discussed confidentiality, professional disclosure statement, what to expect in therapy and shared no show policies with client. Therapist also spent a few minutes checking in with client about the reasons for their visit and establishing rapport before beginning the CCA. Parlee was oriented x5. Mood appeared upbeat. Appearance was casual. Speech was coherent and organized . Thought process  was intact and responsive to questioning . SI/HI were not present. Reported history of medication management. Noted the main symptoms of concern are anxiety.  Aina reported taking the same medication since 29 years old. Teva is changing psychiatrist because she missed several appointments.   Overall Assessment Donisha meets criteria for GAD (generalized anxiety disorder) [F41.1]  as evidenced by  reported current symptoms have included anxiety, easily annoyed and tearfulness, with updated PHQ9 screening today rated 2. Callyn reported ongoing issues with anxiety such as difficulty concentrating and tension, rating a 4 on GAD7 screening. Iyanla reported that she remains close to her immediate family.  Kathe is interested in only Medication Management.    Recommendations for Services/Supports/Treatments: Recommendations for Services/Supports/Treatments Recommendations For Services/Supports/Treatments: Medication Management, Individual Therapy  DSM5 Diagnoses: GAD (generalized anxiety disorder) [F41.1]  Patient Active Problem List    Diagnosis Date Noted   MDD (major depressive disorder), recurrent episode, severe (HCC) 12/22/2015   Depressive disorder 09/14/2012   GAD (generalized anxiety disorder) 09/14/2012   Polysubstance abuse (HCC) 09/14/2012    Patient Centered Plan: Patient is on the following Treatment Plan(s):  Anxiety   Referrals to Alternative Service(s): Referred to Alternative Service(s):   Place:   Date:   Time:    Referred to Alternative Service(s):   Place:   Date:   Time:    Referred to Alternative Service(s):   Place:   Date:   Time:    Referred to Alternative Service(s):   Place:   Date:   Time:      Collaboration of Care: medication management  Patient/Guardian was advised Release of Information must be obtained prior to any record release in order to collaborate their care with an outside provider. Patient/Guardian was advised if they have not already done so to contact the registration department to sign all necessary forms in order for us  to release information regarding their care.   Consent: Patient/Guardian gives verbal consent for treatment and assignment of benefits for services provided during this visit. Patient/Guardian expressed understanding and agreed to proceed.   [name], [facility] 09/13/2024

## 2024-09-20 ENCOUNTER — Ambulatory Visit (HOSPITAL_COMMUNITY): Payer: Self-pay
